# Patient Record
Sex: Female | Born: 1940 | Race: White | Hispanic: No | Smoking: Former smoker
Health system: Southern US, Community
[De-identification: ages and names within clinical notes are randomized; demographics above are authoritative.]

## PROBLEM LIST (undated history)

## (undated) DIAGNOSIS — Z1239 Encounter for other screening for malignant neoplasm of breast: Secondary | ICD-10-CM

## (undated) DIAGNOSIS — Z1211 Encounter for screening for malignant neoplasm of colon: Secondary | ICD-10-CM

## (undated) DIAGNOSIS — C801 Malignant (primary) neoplasm, unspecified: Secondary | ICD-10-CM

## (undated) DIAGNOSIS — R42 Dizziness and giddiness: Secondary | ICD-10-CM

## (undated) DIAGNOSIS — E7800 Pure hypercholesterolemia, unspecified: Secondary | ICD-10-CM

## (undated) DIAGNOSIS — F419 Anxiety disorder, unspecified: Secondary | ICD-10-CM

## (undated) DIAGNOSIS — R928 Other abnormal and inconclusive findings on diagnostic imaging of breast: Secondary | ICD-10-CM

## (undated) DIAGNOSIS — Z87891 Personal history of nicotine dependence: Secondary | ICD-10-CM

## (undated) DIAGNOSIS — R920 Mammographic microcalcification found on diagnostic imaging of breast: Secondary | ICD-10-CM

## (undated) DIAGNOSIS — M1990 Unspecified osteoarthritis, unspecified site: Secondary | ICD-10-CM

## (undated) DIAGNOSIS — I10 Essential (primary) hypertension: Secondary | ICD-10-CM

## (undated) HISTORY — DX: Mammographic microcalcification found on diagnostic imaging of breast: R92.0

## (undated) HISTORY — PX: EYE SURGERY: SHX253

## (undated) HISTORY — PX: BREAST SURGERY: SHX581

## (undated) HISTORY — PX: TONSILLECTOMY: SUR1361

## (undated) HISTORY — DX: Encounter for other screening for malignant neoplasm of breast: Z12.39

## (undated) HISTORY — DX: Other abnormal and inconclusive findings on diagnostic imaging of breast: R92.8

## (undated) HISTORY — PX: BREAST BIOPSY: SHX20

## (undated) HISTORY — DX: Personal history of nicotine dependence: Z87.891

## (undated) HISTORY — PX: TUBAL LIGATION: SHX77

## (undated) HISTORY — DX: Pure hypercholesterolemia, unspecified: E78.00

## (undated) HISTORY — DX: Essential (primary) hypertension: I10

## (undated) HISTORY — DX: Dizziness and giddiness: R42

## (undated) HISTORY — DX: Encounter for screening for malignant neoplasm of colon: Z12.11

---

## 2002-08-21 HISTORY — PX: VASCULAR SURGERY: SHX849

## 2002-08-21 HISTORY — PX: CAROTID ENDARTERECTOMY: SUR193

## 2003-01-23 ENCOUNTER — Ambulatory Visit (HOSPITAL_COMMUNITY): Admission: RE | Admit: 2003-01-23 | Discharge: 2003-01-24 | Payer: Self-pay

## 2003-09-29 ENCOUNTER — Other Ambulatory Visit: Payer: Self-pay

## 2004-06-27 ENCOUNTER — Ambulatory Visit: Payer: Self-pay | Admitting: Internal Medicine

## 2004-09-29 ENCOUNTER — Emergency Department: Payer: Self-pay | Admitting: Emergency Medicine

## 2005-03-09 ENCOUNTER — Ambulatory Visit: Payer: Self-pay | Admitting: Family Medicine

## 2005-10-03 ENCOUNTER — Ambulatory Visit: Payer: Self-pay | Admitting: Family Medicine

## 2006-03-19 ENCOUNTER — Ambulatory Visit: Payer: Self-pay | Admitting: Family Medicine

## 2007-02-18 ENCOUNTER — Ambulatory Visit: Payer: Self-pay | Admitting: Family Medicine

## 2007-03-27 ENCOUNTER — Ambulatory Visit: Payer: Self-pay | Admitting: Family Medicine

## 2008-12-03 ENCOUNTER — Ambulatory Visit: Payer: Self-pay | Admitting: Family Medicine

## 2009-08-21 HISTORY — PX: OTHER SURGICAL HISTORY: SHX169

## 2009-12-23 ENCOUNTER — Ambulatory Visit: Payer: Self-pay | Admitting: Family Medicine

## 2010-01-05 ENCOUNTER — Ambulatory Visit: Payer: Self-pay | Admitting: Family Medicine

## 2010-08-03 ENCOUNTER — Ambulatory Visit: Payer: Self-pay | Admitting: General Surgery

## 2010-08-21 HISTORY — PX: COLONOSCOPY: SHX174

## 2011-03-30 ENCOUNTER — Ambulatory Visit: Payer: Self-pay | Admitting: Family Medicine

## 2011-04-05 ENCOUNTER — Ambulatory Visit: Payer: Self-pay | Admitting: Family Medicine

## 2011-06-02 ENCOUNTER — Ambulatory Visit: Payer: Self-pay | Admitting: General Surgery

## 2011-08-22 HISTORY — PX: CATARACT EXTRACTION EXTRACAPSULAR: SHX1305

## 2011-10-10 ENCOUNTER — Ambulatory Visit: Payer: Self-pay | Admitting: General Surgery

## 2011-10-23 ENCOUNTER — Ambulatory Visit: Payer: Self-pay | Admitting: General Surgery

## 2012-01-25 ENCOUNTER — Ambulatory Visit: Payer: Self-pay | Admitting: Ophthalmology

## 2012-02-09 ENCOUNTER — Ambulatory Visit: Payer: Self-pay | Admitting: Ophthalmology

## 2012-04-23 ENCOUNTER — Ambulatory Visit: Payer: Self-pay | Admitting: General Surgery

## 2012-04-24 ENCOUNTER — Ambulatory Visit: Payer: Self-pay | Admitting: General Surgery

## 2012-05-01 DIAGNOSIS — R928 Other abnormal and inconclusive findings on diagnostic imaging of breast: Secondary | ICD-10-CM | POA: Insufficient documentation

## 2012-05-01 DIAGNOSIS — I10 Essential (primary) hypertension: Secondary | ICD-10-CM | POA: Insufficient documentation

## 2012-09-08 DIAGNOSIS — R928 Other abnormal and inconclusive findings on diagnostic imaging of breast: Secondary | ICD-10-CM

## 2012-09-08 DIAGNOSIS — I10 Essential (primary) hypertension: Secondary | ICD-10-CM

## 2012-09-28 ENCOUNTER — Encounter: Payer: Self-pay | Admitting: General Surgery

## 2012-10-23 ENCOUNTER — Ambulatory Visit: Payer: Self-pay | Admitting: General Surgery

## 2012-11-05 ENCOUNTER — Ambulatory Visit: Payer: Self-pay | Admitting: General Surgery

## 2012-11-09 ENCOUNTER — Emergency Department: Payer: Self-pay | Admitting: Emergency Medicine

## 2012-11-09 LAB — COMPREHENSIVE METABOLIC PANEL
Albumin: 4.1 g/dL (ref 3.4–5.0)
Alkaline Phosphatase: 63 U/L (ref 50–136)
BUN: 7 mg/dL (ref 7–18)
Bilirubin,Total: 0.6 mg/dL (ref 0.2–1.0)
EGFR (African American): >60
EGFR (Non-African Amer.): >60
Potassium: 3.6 mmol/L (ref 3.5–5.1)
Total Protein: 8 g/dL (ref 6.4–8.2)

## 2012-11-09 LAB — CBC
HCT: 43.9 % (ref 35.0–47.0)
HGB: 15 g/dL (ref 12.0–16.0)
MCH: 32.6 pg (ref 26.0–34.0)
MCHC: 34.1 g/dL (ref 32.0–36.0)
Platelet: 218 10*3/uL (ref 150–440)
RBC: 4.6 10*6/uL (ref 3.80–5.20)
RDW: 13.8 % (ref 11.5–14.5)
WBC: 9.5 10*3/uL (ref 3.6–11.0)

## 2012-11-19 ENCOUNTER — Ambulatory Visit (INDEPENDENT_AMBULATORY_CARE_PROVIDER_SITE_OTHER): Payer: Medicare Other | Admitting: General Surgery

## 2012-11-19 ENCOUNTER — Encounter: Payer: Self-pay | Admitting: General Surgery

## 2012-11-19 VITALS — BP 130/72 | HR 70 | Resp 14 | Ht 64.0 in | Wt 153.0 lb

## 2012-11-19 DIAGNOSIS — R928 Other abnormal and inconclusive findings on diagnostic imaging of breast: Secondary | ICD-10-CM

## 2012-11-19 DIAGNOSIS — I10 Essential (primary) hypertension: Secondary | ICD-10-CM

## 2012-11-19 NOTE — Progress Notes (Signed)
Patient ID: Kim Espinoza, female   DOB: [birth year], 72 y.o.   MRN: [id number]  Chief Complaint  Patient presents with  . Breast Problem    mammogram    HPI Kim Espinoza is a 72 y.o. female.  Patient here today for follow up mammogram.  No new breast issues.  Patient with known history of right breast biopsy 2011 and 2013 benign. Sister with history of breast cancer.  HPI  Past Medical History  Diagnosis Date  . Hypercholesterolemia since 2001  . 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 Abnormal mammogram 2011, 2012    Right side.   . Hypertension     since approx. 2001  . Personal history of tobacco use, presenting hazards to health   . Breast screening, unspecified   . Mammographic microcalcification   . Special screening for malignant neoplasms, colon     Past Surgical History  Procedure Laterality Date  . Cataract extraction extracapsular  2013    Left eye  . Colonoscopy  2012    Dr. Lemar Livings  . Breast biopsy-right  2011    Stereotactic - Dr. Lemar Livings- Benign breast tissue containing stromal calcifications wiithin.  Periductal Fibrosis.  . Carotid endarterectomy  2004  . Eye surgery    . Vascular surgery    . Breast surgery Right 2011, 2013    stero biopsy    Family History  Problem Relation Age of Onset  . Cancer Father     lung cancer - deceased  . Cancer Sister 70    breast cancer - alive and well  . Colon cancer Neg Hx   . Ovarian cancer Neg Hx     Social History History  Substance Use Topics  . Smoking status: Never Smoker   . Smokeless tobacco: Never Used  . Alcohol Use: No     Comment: never drinks alcohol    No Known Allergies  Current Outpatient Prescriptions  Medication Sig Dispense Refill  . amoxicillin-clavulanate (AUGMENTIN) 875-125 MG per tablet Take 1 tablet by mouth 2 (two) times daily.       Marland Kitchen atenolol (TENORMIN) 50 MG tablet Take 50 mg by mouth 2 (two) times daily.      . Flaxseed, Linseed, (FLAXSEED OIL) 1000 MG CAPS Take by mouth daily.       . nortriptyline (PAMELOR) 25 MG capsule Take 25 mg by mouth at bedtime.      . Pyridoxine HCl (VITAMIN B-6) 250 MG tablet Take 250 mg by mouth daily.      . simvastatin (ZOCOR) 10 MG tablet Take 10 mg by mouth at bedtime.      . vitamin B-12 (CYANOCOBALAMIN) 250 MCG tablet Take 250 mcg by mouth daily.       No current facility-administered medications for this visit.    Review of Systems Review of Systems  Constitutional: Negative.   Respiratory: Negative.   Cardiovascular: Negative.     Blood pressure 130/72, pulse 70, resp. rate 14, height 5\' 4"  (1.626 m), weight 153 lb (69.4 kg).  Physical Exam Physical Exam  Constitutional: She is oriented to person, place, and time. She appears well-developed and well-nourished.  Cardiovascular: Normal rate and regular rhythm.   Pulmonary/Chest: Effort normal and breath sounds normal. Right breast exhibits no inverted nipple, no mass, no nipple discharge, no skin change and no tenderness. Left breast exhibits no inverted nipple, no mass, no nipple discharge, no skin change and no tenderness. Breasts are symmetrical.  Lymphadenopathy:    She has  no cervical adenopathy.    She has no axillary adenopathy.  Neurological: She is alert and oriented to person, place, and time.  Skin: Skin is warm and dry.    Data Reviewed Left breast mammogram dated October 23, 2012 showed scattered fibroglandular density. Parenchymal density most conspicuous on the true lateral film in the upper aspect of the breast. Focal spot compression views did not show a discrete mass. BI-RAD-2.  Assessment    Benign breast exam.     Plan    The patient will resume annual bilateral mammograms with her primary care provider in September 2014.        Kim Espinoza 11/20/2012, 9:01 PM

## 2012-11-19 NOTE — Patient Instructions (Addendum)
Follow up with Dr Burnett Sheng for annual mammograms.

## 2012-11-20 ENCOUNTER — Encounter: Payer: Self-pay | Admitting: General Surgery

## 2012-11-20 DIAGNOSIS — I10 Essential (primary) hypertension: Secondary | ICD-10-CM | POA: Insufficient documentation

## 2012-12-31 ENCOUNTER — Encounter: Payer: Self-pay | Admitting: General Surgery

## 2013-04-29 ENCOUNTER — Ambulatory Visit: Payer: Self-pay | Admitting: Family Medicine

## 2014-06-22 ENCOUNTER — Encounter: Payer: Self-pay | Admitting: General Surgery

## 2014-07-14 ENCOUNTER — Ambulatory Visit: Payer: Self-pay | Admitting: Family Medicine

## 2014-12-13 NOTE — Op Note (Signed)
PATIENT NAME:  Kim Espinoza, Kim Espinoza MR#:  [id number] DATE OF BIRTH:  April 21, 1941  DATE OF PROCEDURE:  02/09/2012  PREOPERATIVE DIAGNOSIS:  Cataract, left eye.    POSTOPERATIVE DIAGNOSIS:  Cataract, left eye.  PROCEDURE PERFORMED:  Extracapsular cataract extraction using phacoemulsification with placement of an Alcon [id number], 20.5-diopter posterior chamber lens, serial [id number].  SURGEON:  Loura Back. Fransico Sciandra, MD  ASSISTANT:  None.  ANESTHESIA:  4% lidocaine and 0.75% Marcaine in a 50/50 mixture with 10 units/mL of Hylenex added, given as a peribulbar.   ANESTHESIOLOGIST:  Dr. Kayleen Memos  COMPLICATIONS:  None.  ESTIMATED BLOOD LOSS:  Less than 1 ml.  DESCRIPTION OF PROCEDURE:  The patient was brought to the operating room and given a peribulbar block.  The patient was then prepped and draped in the usual fashion.  The vertical rectus muscles were imbricated using 5-0 silk sutures.  These sutures were then clamped to the sterile drapes as bridle sutures.  A limbal peritomy was performed extending two clock hours and hemostasis was obtained with cautery.  A partial thickness scleral groove was made at the surgical limbus and dissected anteriorly in a lamellar dissection using an Alcon crescent knife.  The anterior chamber was entered supero-temporally with a Superblade and through the lamellar dissection with a 2.6 mm keratome.  DisCoVisc was used to replace the aqueous and a continuous tear capsulorrhexis was carried out.  Hydrodissection and hydrodelineation were carried out with balanced salt and a 27 gauge canula.  The nucleus was rotated to confirm the effectiveness of the hydrodissection.  Phacoemulsification was carried out using a divide-and-conquer technique.  Total ultrasound time was 1 minute and 34 seconds with an average power of 21.2 percent and CDE of 31.49.  Irrigation/aspiration was used to remove the residual cortex.  DisCoVisc was used to inflate the capsule and the internal  incision was enlarged to 3 mm with the crescent knife.  The intraocular lens was folded and inserted into the capsular bag using the AcrySert delivery system.  Irrigation/aspiration was used to remove the residual DisCoVisc.  Miostat was injected into the anterior chamber through the paracentesis track to inflate the anterior chamber and induce miosis.  The wound was checked for leaks and none were found. The conjunctiva was closed with cautery and the bridle sutures were removed.  Two drops of 0.3% Vigamox were placed on the eye.   An eye shield was placed on the eye.  The patient was discharged to the recovery room in good condition.  ____________________________ Loura Back Livian Vanderbeck, MD sad:slb D: 02/09/2012 12:04:55 ET T: 02/09/2012 12:16:51 ET JOB#: [id number]  cc: Remo Lipps A. Bexley Laubach, MD, <Dictator> Martie Lee MD ELECTRONICALLY SIGNED 02/12/2012 14:37

## 2015-06-30 ENCOUNTER — Other Ambulatory Visit: Payer: Self-pay | Admitting: Family Medicine

## 2015-06-30 DIAGNOSIS — Z1231 Encounter for screening mammogram for malignant neoplasm of breast: Secondary | ICD-10-CM

## 2015-07-19 ENCOUNTER — Ambulatory Visit
Admission: RE | Admit: 2015-07-19 | Discharge: 2015-07-19 | Disposition: A | Discharge status: Home / Self Care | Payer: Medicare Other | Source: Ambulatory Visit | Attending: Family Medicine | Admitting: Family Medicine

## 2015-07-19 ENCOUNTER — Other Ambulatory Visit: Payer: Self-pay | Admitting: Family Medicine

## 2015-07-19 DIAGNOSIS — Z1231 Encounter for screening mammogram for malignant neoplasm of breast: Secondary | ICD-10-CM

## 2016-02-15 ENCOUNTER — Emergency Department: Payer: Medicare Other

## 2016-02-15 ENCOUNTER — Emergency Department
Admission: EM | Admit: 2016-02-15 | Discharge: 2016-02-15 | Disposition: A | Discharge status: Home / Self Care | Payer: Medicare Other | Attending: Emergency Medicine | Admitting: Emergency Medicine

## 2016-02-15 ENCOUNTER — Encounter: Payer: Self-pay | Admitting: Emergency Medicine

## 2016-02-15 DIAGNOSIS — Y9389 Activity, other specified: Secondary | ICD-10-CM | POA: Insufficient documentation

## 2016-02-15 DIAGNOSIS — S6991XA Unspecified injury of right wrist, hand and finger(s), initial encounter: Secondary | ICD-10-CM | POA: Diagnosis present

## 2016-02-15 DIAGNOSIS — E7800 Pure hypercholesterolemia, unspecified: Secondary | ICD-10-CM | POA: Insufficient documentation

## 2016-02-15 DIAGNOSIS — Y999 Unspecified external cause status: Secondary | ICD-10-CM | POA: Diagnosis not present

## 2016-02-15 DIAGNOSIS — Z85038 Personal history of other malignant neoplasm of large intestine: Secondary | ICD-10-CM | POA: Diagnosis not present

## 2016-02-15 DIAGNOSIS — S63501A Unspecified sprain of right wrist, initial encounter: Secondary | ICD-10-CM

## 2016-02-15 DIAGNOSIS — W010XXA Fall on same level from slipping, tripping and stumbling without subsequent striking against object, initial encounter: Secondary | ICD-10-CM | POA: Insufficient documentation

## 2016-02-15 DIAGNOSIS — Y929 Unspecified place or not applicable: Secondary | ICD-10-CM | POA: Insufficient documentation

## 2016-02-15 DIAGNOSIS — I10 Essential (primary) hypertension: Secondary | ICD-10-CM | POA: Diagnosis not present

## 2016-02-15 NOTE — ED Provider Notes (Signed)
[facility] Emergency Department Provider Note ____________________________________________  Time seen: 65  I have reviewed the triage vital signs and the nursing notes.  HISTORY  Chief Complaint  Wrist Pain  HPI Kim Espinoza is a 75 y.o. female presents to the ED for evaluation of pain to the right wrist. She describes trip and a fall just prior to arrival. She attempted to catch herself using her right wrist and noted injury. She reports pain and swelling primarily to the volar aspect of the wrist. She describes tightness when she flexes the finger tips. She did dose ibuprofen just prior to arrival. She denies any deformity, swelling, or laceration.She denies any other injury at this time.  Past Medical History  Diagnosis Date  . Hypercholesterolemia since 2001  . 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 Abnormal mammogram 2011, 2012    Right side.   . Hypertension     since approx. 2001  . Personal history of tobacco use, presenting hazards to health   . Breast screening, unspecified   . Mammographic microcalcification   . Special screening for malignant neoplasms, colon     Patient Active Problem List   Diagnosis Date Noted  . Essential hypertension, benign 11/20/2012  . Abnormal mammogram 05/01/2012  . Hypertension 05/01/2012    Past Surgical History  Procedure Laterality Date  . Cataract extraction extracapsular  2013    Left eye  . Colonoscopy  2012    Dr. Bary Castilla  . Breast biopsy-right  2011    Stereotactic - Dr. Bary Castilla- Benign breast tissue containing stromal calcifications wiithin.  Periductal Fibrosis.  . Carotid endarterectomy  2004  . Eye surgery    . Vascular surgery    . Breast surgery Right 2011, 2013    stero biopsy  . Breast biopsy Right ??    x2 - core w/clip - neg    Current Outpatient Rx  Name  Route  Sig  Dispense  Refill  . amoxicillin-clavulanate (AUGMENTIN) 875-125 MG per tablet   Oral   Take 1 tablet by mouth 2 (two)  times daily.          Marland Kitchen atenolol (TENORMIN) 50 MG tablet   Oral   Take 50 mg by mouth 2 (two) times daily.         . Flaxseed, Linseed, (FLAXSEED OIL) 1000 MG CAPS   Oral   Take by mouth daily.         . nortriptyline (PAMELOR) 25 MG capsule   Oral   Take 25 mg by mouth at bedtime.         . Pyridoxine HCl (VITAMIN B-6) 250 MG tablet   Oral   Take 250 mg by mouth daily.         . simvastatin (ZOCOR) 10 MG tablet   Oral   Take 10 mg by mouth at bedtime.         . vitamin B-12 (CYANOCOBALAMIN) 250 MCG tablet   Oral   Take 250 mcg by mouth daily.          Allergies Review of patient's allergies indicates no known allergies.  Family History  Problem Relation Age of Onset  . Cancer Father     lung cancer - deceased  . Cancer Sister 46    breast cancer - alive and well  . Breast cancer Sister   . Colon cancer Neg Hx   . Ovarian cancer Neg Hx   . Breast cancer Paternal Aunt  Social History Social History  Substance Use Topics  . Smoking status: Never Smoker   . Smokeless tobacco: Never Used  . Alcohol Use: No     Comment: never drinks alcohol   Review of Systems  Constitutional: Negative for fever. Cardiovascular: Negative for chest pain. Respiratory: Negative for shortness of breath. Gastrointestinal: Negative for abdominal pain, vomiting and diarrhea. Musculoskeletal: Negative for back pain. Right wrist pain as above.  Neurological: Negative for headaches, focal weakness or numbness. ____________________________________________  PHYSICAL EXAM:  VITAL SIGNS: ED Triage Vitals  Enc Vitals Group     BP 02/15/16 1903 205/71 mmHg     Pulse Rate 02/15/16 1900 72     Resp 02/15/16 1900 20     Temp 02/15/16 1900 97.9 F (36.6 C)     Temp Source 02/15/16 1900 Oral     SpO2 02/15/16 1900 98 %     Weight 02/15/16 1900 143 lb (64.864 kg)     Height 02/15/16 1900 5\' 3"  (1.6 m)     Head Cir --      Peak Flow --      Pain Score --      Pain Loc  --      Pain Edu? --      Excl. in [place]? --    Constitutional: Alert and oriented. Well appearing and in no distress. Head: Normocephalic and atraumatic. Cardiovascular: Normal rate, regular rhythm.  Normal distal pulses and cap refill.  Respiratory: Normal respiratory effort. Musculoskeletal: Right wrist with mild volar swelling. No obvious deformity, dislocation, or effusion. Patient with normal composite fist. Normal elbow range of motion on exam. Nontender with normal range of motion in all extremities.  Neurologic: Cranial nerves II through XII grossly intact. Normal intrinsic & opposition testing.  Normal speech and language. No gross focal neurologic deficits are appreciated. Skin:  Skin is warm, dry and intact. No rash noted. ____________________________________________   RADIOLOGY  Right Wrist IMPRESSION: Negative.  I, Raffaele Derise, Dannielle Karvonen, personally viewed and evaluated these images (plain radiographs) as part of my medical decision making, as well as reviewing the written report by the radiologist. ____________________________________________  PROCEDURES  Wrist cock up splint ____________________________________________  INITIAL IMPRESSION / ASSESSMENT AND PLAN / ED COURSE  Patient with a right wrist sprain status post fall. No radiologic evidence of fracture dislocation is appreciated. The patient spit with a wrist cock-up splint for comfort. She will follow up with her primary care provider or Dr. Earnestine Leys for ongoing symptom management. She is advised to dose over-the-counter ibuprofen for pain and inflammation management as well as applying ice for local swelling. ____________________________________________  FINAL CLINICAL IMPRESSION(S) / ED DIAGNOSES  Final diagnoses:  Wrist sprain, right, initial encounter      Melvenia Needles, PA-C 02/15/16 2020  Lisa Roca, MD 02/15/16 2100

## 2016-02-15 NOTE — ED Notes (Signed)
Pt states she tripped and went to catch herself injuring her right wrist..

## 2016-02-15 NOTE — Discharge Instructions (Signed)
Your x-ray does not show any fracture (break) or dislocation to the wrist. You appear to have a moderate sprain to the wrist. Wear the wrist splint for comfort and support for the next week or so. Take Motrin (ibuprofen) or Aleve (naproxen sodium) for pain and inflammation relief. Apply ice to reduce swelling. Follow-up with Dr. Sabra Heck for continued pain or disability.  Wrist Sprain  A sprain is an injury in which a ligament that maintains the proper alignment of a joint is partially or completely torn. The ligaments of the wrist are susceptible to sprains. Sprains are classified into three categories. Grade 1 sprains cause pain, but the tendon is not lengthened. Grade 2 sprains include a lengthened ligament because the ligament is stretched or partially ruptured. With grade 2 sprains there is still function, although the function may be diminished. Grade 3 sprains are characterized by a complete tear of the tendon or muscle, and function is usually impaired. SYMPTOMS   Pain tenderness, inflammation, and/or bruising (contusion) of the injury.  A "pop" or tear felt and/or heard at the time of injury.  Decreased wrist function. CAUSES  A wrist sprain occurs when a force is placed on one or more ligaments that is greater than it/they can withstand. Common mechanisms of injury include:  Catching a ball with your hands.  Repetitive and/ or strenuous extension or flexion of the wrist. RISK INCREASES WITH:  Previous wrist injury.  Contact sports (boxing or wrestling).  Activities in which falling is common.  Poor strength and flexibility.  Improperly fitted or padded protective equipment. PREVENTION  Warm up and stretch properly before activity.  Allow for adequate recovery between workouts.  Maintain physical fitness:  Strength, flexibility, and endurance.  Cardiovascular fitness.  Protect the wrist joint by limiting its motion with the use of taping, braces, or  splints.  Protect the wrist after injury for 6 to 12 months. PROGNOSIS  The prognosis for wrist sprains depends on the degree of injury. Grade 1 sprains require 2 to 6 weeks of treatment. Grade 2 sprains require 6 to 8 weeks of treatment, and grade 3 sprains require up to 12 weeks.  RELATED COMPLICATIONS   Prolonged healing time, if improperly treated or re-injured.  Recurrent symptoms that result in a chronic problem.  Injury to nearby structures (bone, cartilage, nerves, or tendons).  Arthritis of the wrist.  Inability to compete in athletics at a high level.  Wrist stiffness or weakness.  Progression to a complete rupture of the ligament. TREATMENT  Treatment initially involves resting from any activities that aggravate the symptoms, and the use of ice and medications to help reduce pain and inflammation. Your caregiver may recommend immobilizing the wrist for a period of time in order to reduce stress on the ligament and allow for healing. After immobilization it is important to perform strengthening and stretching exercises to help regain strength and a full range of motion. These exercises may be completed at home or with a therapist. Surgery is not usually required for wrist sprains, unless the ligament has been ruptured (grade 3 sprain). MEDICATION   If pain medication is necessary, then nonsteroidal anti-inflammatory medications, such as aspirin and ibuprofen, or other minor pain relievers, such as acetaminophen, are often recommended.  Do not take pain medication for 7 days before surgery.  Prescription pain relievers may be given if deemed necessary by your caregiver. Use only as directed and only as much as you need. HEAT AND COLD  Cold treatment (icing) relieves  pain and reduces inflammation. Cold treatment should be applied for 10 to 15 minutes every 2 to 3 hours for inflammation and pain and immediately after any activity that aggravates your symptoms. Use ice packs or  massage the area with a piece of ice (ice massage).  Heat treatment may be used prior to performing the stretching and strengthening activities prescribed by your caregiver, physical therapist, or athletic trainer. Use a heat pack or soak your injury in warm water. SEEK MEDICAL CARE IF:  Treatment seems to offer no benefit, or the condition worsens.  Any medications produce adverse side effects.   This information is not intended to replace advice given to you by your health care provider. Make sure you discuss any questions you have with your health care provider.   Document Released: 08/07/2005 Document Revised: 04/28/2015 Document Reviewed: 11/19/2008 Elsevier Interactive Patient Education [employer].

## 2016-06-14 ENCOUNTER — Encounter: Payer: Self-pay | Admitting: Emergency Medicine

## 2016-06-14 ENCOUNTER — Emergency Department
Admission: EM | Admit: 2016-06-14 | Discharge: 2016-06-14 | Disposition: A | Discharge status: Home / Self Care | Payer: Medicare Other | Attending: Emergency Medicine | Admitting: Emergency Medicine

## 2016-06-14 ENCOUNTER — Emergency Department: Payer: Medicare Other

## 2016-06-14 DIAGNOSIS — R319 Hematuria, unspecified: Secondary | ICD-10-CM

## 2016-06-14 DIAGNOSIS — I10 Essential (primary) hypertension: Secondary | ICD-10-CM | POA: Insufficient documentation

## 2016-06-14 DIAGNOSIS — K921 Melena: Secondary | ICD-10-CM | POA: Diagnosis not present

## 2016-06-14 DIAGNOSIS — N3091 Cystitis, unspecified with hematuria: Secondary | ICD-10-CM | POA: Diagnosis not present

## 2016-06-14 DIAGNOSIS — R1031 Right lower quadrant pain: Secondary | ICD-10-CM | POA: Diagnosis present

## 2016-06-14 DIAGNOSIS — Z79899 Other long term (current) drug therapy: Secondary | ICD-10-CM | POA: Insufficient documentation

## 2016-06-14 DIAGNOSIS — N3090 Cystitis, unspecified without hematuria: Secondary | ICD-10-CM

## 2016-06-14 LAB — CBC
HCT: 41.7 % (ref 35.0–47.0)
HEMOGLOBIN: 14.4 g/dL (ref 12.0–16.0)
MCH: 31.3 pg (ref 26.0–34.0)
MCHC: 34.4 g/dL (ref 32.0–36.0)
MCV: 91.1 fL (ref 80.0–100.0)
Platelets: 208 10*3/uL (ref 150–440)
RBC: 4.58 MIL/uL (ref 3.80–5.20)
RDW: 14.1 % (ref 11.5–14.5)
WBC: 11.2 10*3/uL — ABNORMAL HIGH (ref 3.6–11.0)

## 2016-06-14 LAB — URINALYSIS COMPLETE WITH MICROSCOPIC (ARMC ONLY)
BILIRUBIN URINE: NEGATIVE
Bacteria, UA: NONE SEEN
Bilirubin Urine: NEGATIVE
GLUCOSE, UA: 50 mg/dL — AB
GLUCOSE, UA: NEGATIVE mg/dL
Ketones, ur: NEGATIVE mg/dL
Ketones, ur: NEGATIVE mg/dL
LEUKOCYTES UA: NEGATIVE
LEUKOCYTES UA: NEGATIVE
NITRITE: NEGATIVE
Nitrite: NEGATIVE
PH: 7 (ref 5.0–8.0)
Protein, ur: 100 mg/dL — AB
Protein, ur: 100 mg/dL — AB
SPECIFIC GRAVITY, URINE: 1.001 — AB (ref 1.005–1.030)
Specific Gravity, Urine: 1.002 — ABNORMAL LOW (ref 1.005–1.030)
pH: 7 (ref 5.0–8.0)

## 2016-06-14 LAB — COMPREHENSIVE METABOLIC PANEL
ALK PHOS: 47 U/L (ref 38–126)
ALT: 28 U/L (ref 14–54)
ANION GAP: 7 (ref 5–15)
AST: 31 U/L (ref 15–41)
Albumin: 4.1 g/dL (ref 3.5–5.0)
BILIRUBIN TOTAL: 0.9 mg/dL (ref 0.3–1.2)
BUN: 7 mg/dL (ref 6–20)
CALCIUM: 9.6 mg/dL (ref 8.9–10.3)
CO2: 28 mmol/L (ref 22–32)
CREATININE: 0.62 mg/dL (ref 0.44–1.00)
Chloride: 105 mmol/L (ref 101–111)
Glucose, Bld: 109 mg/dL — ABNORMAL HIGH (ref 65–99)
Potassium: 4.3 mmol/L (ref 3.5–5.1)
Sodium: 140 mmol/L (ref 135–145)
TOTAL PROTEIN: 7.8 g/dL (ref 6.5–8.1)

## 2016-06-14 LAB — WET PREP, GENITAL
CLUE CELLS WET PREP: NONE SEEN
Sperm: NONE SEEN
Trich, Wet Prep: NONE SEEN
Yeast Wet Prep HPF POC: NONE SEEN

## 2016-06-14 LAB — CHLAMYDIA/NGC RT PCR (ARMC ONLY)
CHLAMYDIA TR: NOT DETECTED
N GONORRHOEAE: NOT DETECTED

## 2016-06-14 MED ORDER — IOPAMIDOL (ISOVUE-370) INJECTION 76%
100.0000 mL | Freq: Once | INTRAVENOUS | Status: AC | PRN
  Administered 2016-06-14: 100 mL via INTRAVENOUS

## 2016-06-14 MED ORDER — SODIUM CHLORIDE 0.9 % IV BOLUS (SEPSIS)
1000.0000 mL | Freq: Once | INTRAVENOUS | Status: AC
  Administered 2016-06-14: 1000 mL via INTRAVENOUS

## 2016-06-14 NOTE — ED Notes (Signed)
Presents with family   States she went out to get the paper this am  And felt slight pain to right groin area and noticed a large amt of blood when she used the bathroom  ..feels like it is all coming from vaginal area

## 2016-06-14 NOTE — ED Notes (Signed)
Pt with dime sized blood clot in urine specimen cup.

## 2016-06-14 NOTE — ED Notes (Signed)
Patient transported to CT 

## 2016-06-14 NOTE — ED Triage Notes (Signed)
Pt reports hematuria today, reports burning with urination and RLQ pain with urination.

## 2016-06-14 NOTE — ED Notes (Signed)
D/c inst to pt and family.   

## 2016-06-14 NOTE — ED Notes (Signed)
Iv started  And fluids infusing.

## 2016-06-14 NOTE — ED Notes (Signed)
Resumed care from [place] rn.  Pelvic done.  toleratied well.  Family with pt.

## 2016-06-14 NOTE — Discharge Instructions (Signed)
Please drink plenty of fluids stay well-hydrated. Please take the entire course of antibiotics, even if you're feeling better.  Please call to make an appointment with Dr. [name], who will give you the results of the CT scan he had in the emergency department and make a plan regarding the blood in your urine.  Please also make an appointment with Dr. Vira Agar, because of the bleeding in your stool.  Return to the emergency department for severe pain, lightheadedness or fainting, shortness of breath, bleeding, fever, or any other symptoms concerning to you.

## 2016-06-14 NOTE — ED Provider Notes (Signed)
[facility] Emergency Department Provider Note  ____________________________________________  Time seen: Approximately 3:30 PM  I have reviewed the triage vital signs and the nursing notes.   HISTORY  Chief Complaint Hematuria    HPI Kim Espinoza is a 75 y.o. female with a history of HTN, HL, not anticoagulated and without any history of gynecologic cancers presenting for bleeding. The patient reports that since early October she has had a right lower quadrant discomfort "like a cuckaberry bush" that is intermittent. Her primary care physician sent her for transvaginal ultrasound which she states was reportedly normal. This morning, she was walking when she had a recurrence of some mild right lower quadrant pain. The next time she went to the bathroom, she noted a significant amount of blood and blood clots in the toilet. She is unclear whether this blood came from her urethra or her vagina; she does not think that it came from her rectum. She has continued to have oozing of blood for the last several hours. She denies any shortness of breath, lightheadedness or fainting. No chest pain or palpitations. Last menstrual period was greater than 15 years ago. Last sexual intercourse greater than 30 years ago. Denies any foreign body in the vagina.  The patient was seen by a 14 assistant and her primary care physician's office and diagnosed with a UTI based off of a urinalysis, but no gynecologic examination was performed. She does report being treated with metronidazole for bacterial vaginosis 05/25/16.  FH: No family history of gynecologic cancers.   Past Medical History:  Diagnosis Date  . Abnormal mammogram 2011, 2012   Right side.   . Breast screening, unspecified   . Hypercholesterolemia since 2001  . Hypertension    since approx. 2001  . Mammographic microcalcification   . Personal history of tobacco use, presenting hazards to health   . Special  screening for malignant neoplasms, colon   . 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 Essential hypertension, benign 11/20/2012  . Abnormal mammogram 05/01/2012  . Hypertension 05/01/2012    Past Surgical History:  Procedure Laterality Date  . BREAST BIOPSY Right ??   x2 - core w/clip - neg  . BREAST BIOPSY-right  2011   Stereotactic - Dr. Bary Castilla- Benign breast tissue containing stromal calcifications wiithin.  Periductal Fibrosis.  Marland Kitchen BREAST SURGERY Right 2011, 2013   stero biopsy  . CAROTID ENDARTERECTOMY  2004  . CATARACT EXTRACTION EXTRACAPSULAR  2013   Left eye  . COLONOSCOPY  2012   Dr. Bary Castilla  . EYE SURGERY    . VASCULAR SURGERY      Current Outpatient Rx  . Order #: [id number] Class: Historical Med  . Order #: [id number] Class: Historical Med  . Order #: [id number] Class: Historical Med  . Order #: [id number] Class: Historical Med  . Order #: [id number] Class: Historical Med  . Order #: [id number] Class: Historical Med  . Order #: [id number] Class: Historical Med    Allergies Review of patient's allergies indicates no known allergies.  Family History  Problem Relation Age of Onset  . Cancer Father     lung cancer - deceased  . Cancer Sister 44    breast cancer - alive and well  . Breast cancer Sister   . Breast cancer Paternal Aunt   . Colon cancer Neg Hx   . Ovarian cancer Neg Hx     Social History Social History  Substance Use Topics  .  Smoking status: Never Smoker  . Smokeless tobacco: Never Used  . Alcohol use No     Comment: never drinks alcohol    Review of Systems Constitutional: No fever/chills.No lightheadedness or syncope. No recent weight loss. Eyes: No visual changes. ENT:  No congestion or rhinorrhea. Cardiovascular: Denies chest pain. Denies palpitations. Respiratory: Denies shortness of breath.  No cough. Gastrointestinal: Positive right lower quadrant abdominal pain.  No nausea, no vomiting.  No diarrhea.   No constipation. Genitourinary: Negative for dysuria. Positive for possible vaginal or urethral bleeding. Negative for urinary frequency. Positive for unchanged chronic mild stress incontinence. Musculoskeletal: Negative for back pain. Skin: Negative for rash. Neurological: Negative for headaches. No focal numbness, tingling or weakness.   10-point ROS otherwise negative.  ____________________________________________   PHYSICAL EXAM:  VITAL SIGNS: ED Triage Vitals [06/14/16 1306]  Enc Vitals Group     BP (!) 216/79     Pulse Rate 88     Resp 20     Temp 98.1 F (36.7 C)     Temp Source Oral     SpO2 98 %     Weight 145 lb (65.8 kg)     Height 5\' 3"  (1.6 m)     Head Circumference      Peak Flow      Pain Score 0     Pain Loc      Pain Edu?      Excl. in [place]?     Constitutional: Alert and oriented. Well appearing and in no acute distress. Answers questions appropriately. Eyes: Conjunctivae are normalAnd without pallor.  EOMI. No scleral icterus. Head: Atraumatic. Nose: No congestion/rhinnorhea. Mouth/Throat: Mucous membranes are moist.  Neck: No stridor.  Supple.  No meningismus. Cardiovascular: Normal rate, regular rhythm. No murmurs, rubs or gallops.  Respiratory: Normal respiratory effort.  No accessory muscle use or retractions. Lungs CTAB.  No wheezes, rales or ronchi. Gastrointestinal: Soft, nontender and nondistended. I'm unable to reproduce the patient's discomfort. No guarding or rebound.  No peritoneal signs. Genitourinary: Normal-appearing external genitalia without lesions. Normal vaginal exam with mildly atrophic vaginal tissue and minimal clear discharge, normal-appearing cervix. No evidence of vaginal bleeding. Bimanual exam is negative for CMT, adnexal tenderness to palpation, no palpable masses. Rectal examination without external palpable internal hemorrhoids. Stool is dark and guaiac positive. No pain with rectal examination. Musculoskeletal: No LE edema.   Neurologic:  A&Ox3.  Speech is clear.  Face and smile are symmetric.  EOMI.  Moves all extremities well. Skin:  Skin is warm, dry and intact. No rash noted. No pallor. Psychiatric: Mood and affect are normal. Speech and behavior are normal.  Normal judgement.  ____________________________________________   LABS (all labs ordered are listed, but only abnormal results are displayed)  Labs Reviewed  WET PREP, GENITAL - Abnormal; Notable for the following:       Result Value   WBC, Wet Prep HPF POC FEW (*)    All other components within normal limits  URINALYSIS COMPLETEWITH MICROSCOPIC ([facility] ONLY) - Abnormal; Notable for the following:    Color, Urine RED (*)    APPearance CLEAR (*)    Glucose, UA 50 (*)    Specific Gravity, Urine 1.002 (*)    Hgb urine dipstick 2+ (*)    Protein, ur 100 (*)    Squamous Epithelial / LPF 0-5 (*)    All other components within normal limits  CBC - Abnormal; Notable for the following:    WBC 11.2 (*)  All other components within normal limits  COMPREHENSIVE METABOLIC PANEL - Abnormal; Notable for the following:    Glucose, Bld 109 (*)    All other components within normal limits  URINALYSIS COMPLETEWITH MICROSCOPIC ([facility] ONLY) - Abnormal; Notable for the following:    Color, Urine RED (*)    APPearance CLEAR (*)    Specific Gravity, Urine 1.001 (*)    Hgb urine dipstick 3+ (*)    Protein, ur 100 (*)    Bacteria, UA RARE (*)    Squamous Epithelial / LPF 0-5 (*)    All other components within normal limits  CHLAMYDIA/NGC RT PCR ([facility] ONLY)   ____________________________________________  EKG  Not indicated ____________________________________________  RADIOLOGY  No results found.  ____________________________________________   PROCEDURES  Procedure(s) performed: None  Procedures  Critical Care performed: No ____________________________________________   INITIAL IMPRESSION / ASSESSMENT AND PLAN / ED  COURSE  Pertinent labs & imaging results that were available during my care of the patient were reviewed by me and considered in my medical decision making (see chart for details).  75 y.o. female who is not anticoagulated presenting with bleeding, thought to be either vaginal or urethral. On physical examination, the patient is mildly hypertensive with a normal heart rate. Her abdomen is soft and nontender. I am concerned about vaginal bleeding causes including infection, malignancy, tear or abrasion in atrophic postmenopausal tissue. We will also get an in and out catheter to confirm the results of her urinalysis as I believe her urine test is probably contaminated from vaginal blood and do not want to commit her to antibiotics for UTI if she does not in fact have a UTI. Will also rule out anemia.  ----------------------------------------- 3:56 PM on 06/14/2016 -----------------------------------------  At this time, does not appear that the patient has any bleeding in the vaginal vault. It is possible that she had vaginal bleeding and it resolved, but this would be less likely. I am concerned that she does have some dark guaiac positive stool and that this may in fact have been GI blood. We are obtaining a catheterized urine specimen to evaluate for blood as well. Once the source of her blood is pinpointed, and her blood counts have returned, we will make a final disposition plan.  ----------------------------------------- 5:28 PM on 06/14/2016 -----------------------------------------  At this time, the patient has confirmed bleeding from the urethra with blood in her urine and rare bacteria but no nitrites. I am concerned about acute cystitis versus bladder or GU malignancy. This will require outpatient evaluation with urology and I'll speak with Dr. Erlene Quan, who is on-call for [place] today, about outpatient follow-up. The patient's creatinine today is 0.62, her blood counts are normal with a  hemoglobin of 14.4, and she is hemodynamically stable. The patient will complete the course of ciprofloxacin that was prescribed to her by her primary care physician. In addition, the patient does have dark stool is guaiac positive and will need GI follow-up for that as well.  I have discussed the follow-up plan with both the patient and her daughter, who understand and were able to ask questions. Return precautions were also discussed.    ____________________________________________  FINAL CLINICAL IMPRESSION(S) / ED DIAGNOSES  Final diagnoses:  Hematuria, unspecified type  Cystitis  Melena  Right lower quadrant abdominal pain  Hematuria    Clinical Course      NEW MEDICATIONS STARTED DURING THIS VISIT:  New Prescriptions   No medications on file      Anne-Caroline  Mariea Clonts, MD 06/14/16 [phone]

## 2016-06-14 NOTE — ED Notes (Signed)
Changed pt bed sheets and repositioned. Drained pt bladder.

## 2016-06-16 LAB — URINE CULTURE: CULTURE: NO GROWTH

## 2016-06-27 ENCOUNTER — Ambulatory Visit: Payer: Medicare Other | Admitting: Urology

## 2016-06-27 ENCOUNTER — Other Ambulatory Visit: Payer: Self-pay | Admitting: Radiology

## 2016-06-27 ENCOUNTER — Encounter: Payer: Self-pay | Admitting: Urology

## 2016-06-27 VITALS — BP 177/78 | HR 71 | Ht 63.0 in | Wt 144.0 lb

## 2016-06-27 DIAGNOSIS — R319 Hematuria, unspecified: Secondary | ICD-10-CM

## 2016-06-27 DIAGNOSIS — D494 Neoplasm of unspecified behavior of bladder: Secondary | ICD-10-CM

## 2016-06-27 LAB — URINALYSIS, COMPLETE
Bilirubin, UA: NEGATIVE
Glucose, UA: NEGATIVE
Ketones, UA: NEGATIVE
Leukocytes, UA: NEGATIVE
Nitrite, UA: NEGATIVE
PH UA: 5 (ref 5.0–7.5)
PROTEIN UA: NEGATIVE
Specific Gravity, UA: 1.01 (ref 1.005–1.030)
UUROB: 0.2 mg/dL (ref 0.2–1.0)

## 2016-06-27 LAB — MICROSCOPIC EXAMINATION
BACTERIA UA: NONE SEEN
WBC UA: NONE SEEN /HPF (ref 0–?)

## 2016-06-27 MED ORDER — LIDOCAINE HCL 2 % EX GEL
1.0000 "application " | Freq: Once | CUTANEOUS | Status: AC
  Administered 2016-06-27: 1 via URETHRAL

## 2016-06-27 MED ORDER — CIPROFLOXACIN HCL 500 MG PO TABS
500.0000 mg | ORAL_TABLET | Freq: Once | ORAL | Status: AC
  Administered 2016-06-27: 500 mg via ORAL

## 2016-06-27 NOTE — Progress Notes (Signed)
   06/27/16  CC:  Chief Complaint  Patient presents with  . Cysto    hematuria    HPI: 75 year old female who presents today for cystoscopy and to review the patient's CT scan. She presented initially with asymptomatic gross hematuria. Her hematuria has resolved at this time. She denies any dysuria, fevers, or chills.  The patient is otherwise quite healthy, she is able to walk 2.5 miles. She is nondiabetic without any cardiac or coronary history.  Blood pressure (!) 177/78, pulse 71, height 5\' 3"  (1.6 m), weight 65.3 kg (144 lb). NED. A&Ox3.   No respiratory distress   Abd soft, NT, ND Normal external genitalia with patent urethral meatus  Cystoscopy Procedure Note  Patient identification was confirmed, informed consent was obtained, and patient was prepped using Betadine solution.  Lidocaine jelly was administered per urethral meatus.    Preoperative abx where received prior to procedure.    Procedure: - Flexible cystoscope introduced, without any difficulty.   - Thorough search of the bladder revealed:    normal urethral meatus    normal urothelium    no stones    no ulcers     The patient had 2 small tumors on the right lateral wall just lateral to the ureteral orifice. Each tumor was approximately 1 cm in size, on a narrow stalk, low-grade appearing.    no urethral polyps    no trabeculation  - Ureteral orifices were normal in position and appearance.  Post-Procedure: - Patient tolerated the procedure well  Assessment/ Plan:   I reviewed the findings on the cystoscopic evaluation including 2 small right lateral wall bladder tumors. I explained to the patient and her daughter that this was consistent with a low-grade bladder cancer. I then described for her the surgery of transurethral resection of the bladder tumor with associated bilateral retrograde pyelograms. We also discussed postoperative mitomycin C instillation. I reviewed the procedure as well as the risks  and the benefits thereof. I explained to the patient that this was an outpatient procedure. Given the patient's otherwise very good health, she does not need cardiac clearance. Having gone through the procedure with the patient and answered all her questions and agreed to proceed. We will get this scheduled with Dr. Erlene Quan in the near future.   Ardis Hughs, MD

## 2016-06-27 NOTE — Addendum Note (Signed)
Addended by: Wilson Singer on: 06/27/2016 11:42 AM   Modules accepted: Orders

## 2016-06-28 ENCOUNTER — Telehealth: Payer: Self-pay | Admitting: Radiology

## 2016-06-28 NOTE — Telephone Encounter (Signed)
Notified pt of surgery scheduled with Dr Erlene Quan on 07/11/16, pre-admit testing appt on 06/29/16 @10 :15 & to call day prior to surgery for arrival time to SDS. Pt voices understanding.

## 2016-06-29 ENCOUNTER — Encounter
Admission: RE | Admit: 2016-06-29 | Discharge: 2016-06-29 | Disposition: A | Discharge status: Home / Self Care | Payer: Medicare Other | Source: Ambulatory Visit | Attending: Urology | Admitting: Urology

## 2016-06-29 DIAGNOSIS — I10 Essential (primary) hypertension: Secondary | ICD-10-CM | POA: Insufficient documentation

## 2016-06-29 DIAGNOSIS — Z01810 Encounter for preprocedural cardiovascular examination: Secondary | ICD-10-CM | POA: Diagnosis not present

## 2016-06-29 HISTORY — DX: Anxiety disorder, unspecified: F41.9

## 2016-06-29 NOTE — Patient Instructions (Signed)
  Your procedure is scheduled [id number]. 07/11/16 Report to Day Surgery.[place] 2nd floor To find out your arrival time please call [phone] between [place] on Mon. 07/10/16  Remember: Instructions that are not followed completely may result in serious medical risk, up to and including death, or upon the discretion of your surgeon and anesthesiologist your surgery may need to be rescheduled.    __x__ 1. Do not eat food or drink liquids after midnight. No gum chewing or hard candies.     __x__ 2. No Alcohol for 24 hours before or after surgery.   ____ 3. Do Not Smoke For 24 Hours Prior to Your Surgery.   ____ 4. Bring all medications with you on the day of surgery if instructed.    __x__ 5. Notify your doctor if there is any change in your medical condition     (cold, fever, infections).       Do not wear jewelry, make-up, hairpins, clips or nail polish.  Do not wear lotions, powders, or perfumes. You may wear deodorant.  Do not shave 48 hours prior to surgery. Men may shave face and neck.  Do not bring valuables to the hospital.    [facility] is not responsible for any belongings or valuables.               Contacts, dentures or bridgework may not be worn into surgery.  Leave your suitcase in the car. After surgery it may be brought to your room.  For patients admitted to the hospital, discharge time is determined by your                treatment team.   Patients discharged the day of surgery will not be allowed to drive home.   Please read over the following fact sheets that you were given:      _x___ Take these medicines the morning of surgery with A SIP OF WATER:    1. atenolol (TENORMIN) 50 MG tablet  2.   3.   4.  5.  6.  ____ Fleet Enema (as directed)   ____ Use CHG Soap as directed  ____ Use inhalers on the day of surgery  ____ Stop metformin 2 days prior to surgery    ____ Take 1/2 of usual insulin dose the night before surgery and none on the  morning of surgery.   __x__ Stop aspirin on 07/04/16  ___x_ Stop Anti-inflammatories on (Aleve) on 07/04/16     ___x_ Stop supplements on 07/04/16 until after surgery.    ____ Bring C-Pap to the hospital.

## 2016-06-29 NOTE — Pre-Procedure Instructions (Signed)
10 Dr. Cherrie Gauze office notified that we need an H&P before surgery.

## 2016-06-30 LAB — CULTURE, URINE COMPREHENSIVE

## 2016-07-03 ENCOUNTER — Other Ambulatory Visit: Payer: Self-pay | Admitting: Family Medicine

## 2016-07-03 DIAGNOSIS — I739 Peripheral vascular disease, unspecified: Secondary | ICD-10-CM

## 2016-07-04 ENCOUNTER — Other Ambulatory Visit: Payer: Self-pay | Admitting: Family Medicine

## 2016-07-04 ENCOUNTER — Ambulatory Visit
Admission: RE | Admit: 2016-07-04 | Discharge: 2016-07-04 | Disposition: A | Discharge status: Home / Self Care | Payer: Medicare Other | Source: Ambulatory Visit | Attending: Family Medicine | Admitting: Family Medicine

## 2016-07-04 DIAGNOSIS — I6523 Occlusion and stenosis of bilateral carotid arteries: Secondary | ICD-10-CM | POA: Insufficient documentation

## 2016-07-04 DIAGNOSIS — I739 Peripheral vascular disease, unspecified: Secondary | ICD-10-CM | POA: Diagnosis present

## 2016-07-04 DIAGNOSIS — Z1231 Encounter for screening mammogram for malignant neoplasm of breast: Secondary | ICD-10-CM

## 2016-07-07 ENCOUNTER — Other Ambulatory Visit: Payer: Medicare Other

## 2016-07-11 ENCOUNTER — Encounter
Admission: RE | Disposition: A | Discharge status: Home / Self Care | Payer: Self-pay | Source: Ambulatory Visit | Attending: Urology

## 2016-07-11 ENCOUNTER — Ambulatory Visit: Payer: Medicare Other | Admitting: Anesthesiology

## 2016-07-11 ENCOUNTER — Ambulatory Visit
Admission: RE | Admit: 2016-07-11 | Discharge: 2016-07-11 | Disposition: A | Discharge status: Home / Self Care | Payer: Medicare Other | Source: Ambulatory Visit | Attending: Urology | Admitting: Urology

## 2016-07-11 ENCOUNTER — Encounter: Payer: Self-pay | Admitting: Anesthesiology

## 2016-07-11 DIAGNOSIS — C678 Malignant neoplasm of overlapping sites of bladder: Secondary | ICD-10-CM | POA: Diagnosis not present

## 2016-07-11 DIAGNOSIS — R310 Gross hematuria: Secondary | ICD-10-CM | POA: Insufficient documentation

## 2016-07-11 DIAGNOSIS — Z87891 Personal history of nicotine dependence: Secondary | ICD-10-CM | POA: Diagnosis not present

## 2016-07-11 DIAGNOSIS — D494 Neoplasm of unspecified behavior of bladder: Secondary | ICD-10-CM

## 2016-07-11 DIAGNOSIS — Z7982 Long term (current) use of aspirin: Secondary | ICD-10-CM | POA: Diagnosis not present

## 2016-07-11 DIAGNOSIS — F419 Anxiety disorder, unspecified: Secondary | ICD-10-CM | POA: Insufficient documentation

## 2016-07-11 DIAGNOSIS — I10 Essential (primary) hypertension: Secondary | ICD-10-CM | POA: Diagnosis not present

## 2016-07-11 HISTORY — PX: TRANSURETHRAL RESECTION OF BLADDER TUMOR WITH MITOMYCIN-C: SHX6459

## 2016-07-11 SURGERY — TRANSURETHRAL RESECTION OF BLADDER TUMOR WITH MITOMYCIN-C
Anesthesia: General | Wound class: Clean Contaminated

## 2016-07-11 MED ORDER — ONDANSETRON HCL 4 MG/2ML IJ SOLN
INTRAMUSCULAR | Status: DC | PRN
  Administered 2016-07-11: 4 mg via INTRAVENOUS

## 2016-07-11 MED ORDER — FAMOTIDINE 20 MG PO TABS
ORAL_TABLET | ORAL | Status: AC
  Administered 2016-07-11: 20 mg via ORAL
  Filled 2016-07-11: qty 1

## 2016-07-11 MED ORDER — LIDOCAINE HCL (CARDIAC) 20 MG/ML IV SOLN
INTRAVENOUS | Status: DC | PRN
  Administered 2016-07-11: 40 mg via INTRAVENOUS

## 2016-07-11 MED ORDER — FENTANYL CITRATE (PF) 100 MCG/2ML IJ SOLN
INTRAMUSCULAR | Status: DC | PRN
  Administered 2016-07-11 (×2): 25 ug via INTRAVENOUS
  Administered 2016-07-11: 50 ug via INTRAVENOUS

## 2016-07-11 MED ORDER — MITOMYCIN CHEMO FOR BLADDER INSTILLATION 40 MG
40.0000 mg | Freq: Once | INTRAVENOUS | Status: AC
  Administered 2016-07-11: 40 mg via INTRAVESICAL
  Filled 2016-07-11: qty 40

## 2016-07-11 MED ORDER — PROPOFOL 10 MG/ML IV BOLUS
INTRAVENOUS | Status: DC | PRN
  Administered 2016-07-11: 150 mg via INTRAVENOUS

## 2016-07-11 MED ORDER — GLYCOPYRROLATE 0.2 MG/ML IJ SOLN
INTRAMUSCULAR | Status: DC | PRN
  Administered 2016-07-11: .2 mg via INTRAVENOUS

## 2016-07-11 MED ORDER — CIPROFLOXACIN IN D5W 400 MG/200ML IV SOLN
400.0000 mg | INTRAVENOUS | Status: AC
Start: 2016-07-11 — End: 2016-07-11
  Administered 2016-07-11: 400 mg via INTRAVENOUS

## 2016-07-11 MED ORDER — HYDROCODONE-ACETAMINOPHEN 5-325 MG PO TABS: 1.0000 | ORAL_TABLET | Freq: Four times a day (QID) | ORAL | 0 refills | Status: DC | PRN

## 2016-07-11 MED ORDER — FENTANYL CITRATE (PF) 100 MCG/2ML IJ SOLN: 25.0000 ug | INTRAMUSCULAR | Status: DC | PRN

## 2016-07-11 MED ORDER — CIPROFLOXACIN IN D5W 400 MG/200ML IV SOLN
INTRAVENOUS | Status: AC
  Administered 2016-07-11: 400 mg via INTRAVENOUS
  Filled 2016-07-11: qty 200

## 2016-07-11 MED ORDER — DEXAMETHASONE SODIUM PHOSPHATE 10 MG/ML IJ SOLN
INTRAMUSCULAR | Status: DC | PRN
  Administered 2016-07-11: 10 mg via INTRAVENOUS

## 2016-07-11 MED ORDER — ONDANSETRON HCL 4 MG/2ML IJ SOLN: 4.0000 mg | Freq: Once | INTRAMUSCULAR | Status: DC | PRN

## 2016-07-11 MED ORDER — FAMOTIDINE 20 MG PO TABS
20.0000 mg | ORAL_TABLET | Freq: Once | ORAL | Status: AC
  Administered 2016-07-11: 20 mg via ORAL

## 2016-07-11 MED ORDER — OXYBUTYNIN CHLORIDE 5 MG PO TABS: 5.0000 mg | ORAL_TABLET | Freq: Three times a day (TID) | ORAL | 0 refills | Status: DC | PRN

## 2016-07-11 MED ORDER — MIDAZOLAM HCL 2 MG/2ML IJ SOLN
INTRAMUSCULAR | Status: DC | PRN
  Administered 2016-07-11: 1 mg via INTRAVENOUS

## 2016-07-11 MED ORDER — LACTATED RINGERS IV SOLN
INTRAVENOUS | Status: DC
  Administered 2016-07-11: 09:00:00 via INTRAVENOUS
  Administered 2016-07-11: 100 mL/h via INTRAVENOUS

## 2016-07-11 SURGICAL SUPPLY — 33 items
BAG DRAIN CYSTO-URO LG1000N (MISCELLANEOUS) ×4 IMPLANT
BAG URO DRAIN 2000ML W/SPOUT (MISCELLANEOUS) ×4 IMPLANT
CATH FOLEY 2WAY  5CC 16FR (CATHETERS)
CATH FOLEY 2WAY 5CC 16FR (CATHETERS)
CATH URETL 5X70 OPEN END (CATHETERS) ×4 IMPLANT
CATH URTH 16FR FL 2W BLN LF (CATHETERS) ×1 IMPLANT
CONRAY 43 FOR UROLOGY 50M (MISCELLANEOUS) ×4 IMPLANT
CORD URO TURP 10FT (MISCELLANEOUS) ×1 IMPLANT
DRAPE UTILITY 15X26 TOWEL STRL (DRAPES) ×4 IMPLANT
ELECT LOOP 22F BIPOLAR SML (ELECTROSURGICAL)
ELECT REM PT RETURN 9FT ADLT (ELECTROSURGICAL) ×4
ELECTRODE LOOP 22F BIPOLAR SML (ELECTROSURGICAL) ×1 IMPLANT
ELECTRODE REM PT RTRN 9FT ADLT (ELECTROSURGICAL) ×1 IMPLANT
GLOVE BIO SURGEON STRL SZ 6.5 (GLOVE) ×1 IMPLANT
GLOVE BIO SURGEONS STRL SZ 6.5 (GLOVE)
GOWN STRL REUS W/ TWL LRG LVL3 (GOWN DISPOSABLE) ×4 IMPLANT
GOWN STRL REUS W/TWL LRG LVL3 (GOWN DISPOSABLE) ×8
KIT RM TURNOVER CYSTO AR (KITS) ×4 IMPLANT
LOOP CUT BIPOLAR 24F LRG (ELECTROSURGICAL) IMPLANT
PACK CYSTO AR (MISCELLANEOUS) ×4 IMPLANT
PLUG CATH AND CAP STER (CATHETERS) ×1 IMPLANT
PREP PVP WINGED SPONGE (MISCELLANEOUS) ×4 IMPLANT
SENSORWIRE 0.038 NOT ANGLED (WIRE) ×4
SET CYSTO W/LG BORE CLAMP LF (SET/KITS/TRAYS/PACK) ×1 IMPLANT
SET IRRIG Y TYPE TUR BLADDER L (SET/KITS/TRAYS/PACK) ×4 IMPLANT
SET IRRIGATING DISP (SET/KITS/TRAYS/PACK) ×4 IMPLANT
SOL .9 NS 3000ML IRR  AL (IV SOLUTION) ×2
SOL .9 NS 3000ML IRR AL (IV SOLUTION) ×2
SOL .9 NS 3000ML IRR UROMATIC (IV SOLUTION) ×2 IMPLANT
SURGILUBE 2OZ TUBE FLIPTOP (MISCELLANEOUS) ×4 IMPLANT
SYRINGE IRR TOOMEY STRL 70CC (SYRINGE) ×4 IMPLANT
WATER STERILE IRR 1000ML POUR (IV SOLUTION) ×4 IMPLANT
WIRE SENSOR 0.038 NOT ANGLED (WIRE) ×2 IMPLANT

## 2016-07-11 NOTE — Transfer of Care (Signed)
Immediate Anesthesia Transfer of Care Note  Patient: Kim Espinoza  Procedure(s) Performed: Procedure(s): TRANSURETHRAL RESECTION OF BLADDER TUMOR WITH MITOMYCIN-C (N/A)  Patient Location: PACU  Anesthesia Type:General  Level of Consciousness: sedated  Airway & Oxygen Therapy: Patient Spontanous Breathing  Post-op Assessment: Report given to RN and Post -op Vital signs reviewed and stable  Post vital signs: Reviewed and stable  Last Vitals:  Vitals:   07/11/16 0744 07/11/16 1015  BP: (!) 182/80 127/65  Pulse: 70 69  Resp: 16 11  Temp: 37 C 36.5 C    Last Pain:  Vitals:   07/11/16 0744  TempSrc: Tympanic  PainSc: 0-No pain      Patients Stated Pain Goal: 0 ([id number])  Complications: No apparent anesthesia complications

## 2016-07-11 NOTE — Anesthesia Procedure Notes (Signed)
Procedure Name: LMA Insertion Date/Time: 07/11/2016 9:30 AM Performed by: Allean Found Pre-anesthesia Checklist: Patient identified, Emergency Drugs available, Suction available, Patient being monitored and Timeout performed Patient Re-evaluated:Patient Re-evaluated prior to inductionOxygen Delivery Method: Circle system utilized Preoxygenation: Pre-oxygenation with 100% oxygen Intubation Type: IV induction Ventilation: Mask ventilation without difficulty LMA: LMA inserted LMA Size: 4.0 Number of attempts: 1 Tube secured with: Tape Dental Injury: Teeth and Oropharynx as per pre-operative assessment

## 2016-07-11 NOTE — Op Note (Signed)
Date of procedure: 07/11/16  Preoperative diagnosis:  1. Gross hematuria 2. Bladder mass 2   Postoperative diagnosis:  1. Same as above   Procedure: 1. TURBT, 2 cm 2. Instillation of intravesical mitomycin  Surgeon: [name], MD  Anesthesia: General  Complications: None  Intraoperative findings: 2 cm papillary tumor on a broad base stalk on right lateral wall of the bladder with adjacent 1 cm satellite tumor near bladder neck  EBL: Minimal  Specimens: Bladder tumor, deep base of bladder tumor  Drains: 16 French Foley catheter  Indication: Kim Espinoza is a 75 y.o. 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 She recently underwent CT urogram for further evaluation of this..  After reviewing the management options for treatment, she elected to proceed with the above surgical procedure(s). We have discussed the potential benefits and risks of the procedure, side effects of the proposed treatment, the likelihood of the patient achieving the goals of the procedure, and any potential problems that might occur during the procedure or recuperation. Informed consent has been obtained.  Description of procedure:  The patient was taken to the operating room and general anesthesia was induced.  The patient was placed in the dorsal lithotomy position, prepped and draped in the usual sterile fashion, and preoperative antibiotics were administered. A preoperative time-out was performed.   A 21 French cystoscope with 30 lens was advanced per urethra into the bladder. The bladder was carefully inspected. The trigone was normal with orthotopic UOs with clear yellow urine from both. On the right lateral wall of the bladder there was approximately 2 cm papillary tumor on a relatively broad base stalk. Adjacent to this, there is a 1 cm bladder tumor is a satellite lesion on the right lateral wall which was closer to the  bladder neck. The remainder of the bladder was relatively unremarkable without evidence of patchy erythema or additional lesions. Cold cup biopsy forceps were then used to piecewise resect both of the tumors. Once the majority of the bulk of the tumor was removed, cold cups are used at the base of the tumor to take deeper biopsies down to the level of the muscularis propria which was clearly visible.  This was passed off the field as a separate specimen labeled deep base. The bed of the tumor was then fulgurated using Bugbee electrocautery. Care was taken to avoid any fulguration and around the ureteral orifice. Once adequate hemostasis was achieved, the right UO was carefully reinspected and noted to have clear yellow urine efflux.  The bladder was then drained and the scope was removed. A 16 French Foley catheter was then placed. The patient was cleaned and dried, repositioned supine position, reversed from anesthesia, taken to the PACU in stable condition. The bladder was instilled with 40 mg of intravesical mitomycin which was allowed to dwell in the PACU for approximate one hour. She tolerated this well. After an hour, the bladder was drained and the Foley was removed.  Plan: Patient will follow-up in 1 week for pathology results review. Findings were discussed with her family. Retropyelogram was deferred today in the setting of recent retrograde pyelogram with adequate delayed imaging.  [name], M.D.

## 2016-07-11 NOTE — Anesthesia Postprocedure Evaluation (Signed)
Anesthesia Post Note  Patient: JENELLA WYSOCKI  Procedure(s) Performed: Procedure(s) (LRB): TRANSURETHRAL RESECTION OF BLADDER TUMOR WITH MITOMYCIN-C (N/A)  Patient location during evaluation: PACU Anesthesia Type: General Level of consciousness: awake and alert Pain management: pain level controlled Vital Signs Assessment: post-procedure vital signs reviewed and stable Respiratory status: spontaneous breathing, nonlabored ventilation, respiratory function stable and patient connected to nasal cannula oxygen Cardiovascular status: blood pressure returned to baseline and stable Postop Assessment: no signs of nausea or vomiting Anesthetic complications: no    Last Vitals:  Vitals:   07/11/16 1120 07/11/16 1138  BP: (!) 157/104 (!) 162/66  Pulse: 63 62  Resp: 18 18  Temp: (!) 36 C     Last Pain:  Vitals:   07/11/16 1120  TempSrc: Temporal  PainSc:                  Nuno Brubacher S

## 2016-07-11 NOTE — H&P (View-Only) (Signed)
   06/27/16  CC:  Chief Complaint  Patient presents with  . Cysto    hematuria    HPI: 75 year old female who presents today for cystoscopy and to review the patient's CT scan. She presented initially with asymptomatic gross hematuria. Her hematuria has resolved at this time. She denies any dysuria, fevers, or chills.  The patient is otherwise quite healthy, she is able to walk 2.5 miles. She is nondiabetic without any cardiac or coronary history.  Blood pressure (!) 177/78, pulse 71, height 5\' 3"  (1.6 m), weight 65.3 kg (144 lb). NED. A&Ox3.   No respiratory distress   Abd soft, NT, ND Normal external genitalia with patent urethral meatus  Cystoscopy Procedure Note  Patient identification was confirmed, informed consent was obtained, and patient was prepped using Betadine solution.  Lidocaine jelly was administered per urethral meatus.    Preoperative abx where received prior to procedure.    Procedure: - Flexible cystoscope introduced, without any difficulty.   - Thorough search of the bladder revealed:    normal urethral meatus    normal urothelium    no stones    no ulcers     The patient had 2 small tumors on the right lateral wall just lateral to the ureteral orifice. Each tumor was approximately 1 cm in size, on a narrow stalk, low-grade appearing.    no urethral polyps    no trabeculation  - Ureteral orifices were normal in position and appearance.  Post-Procedure: - Patient tolerated the procedure well  Assessment/ Plan:   I reviewed the findings on the cystoscopic evaluation including 2 small right lateral wall bladder tumors. I explained to the patient and her daughter that this was consistent with a low-grade bladder cancer. I then described for her the surgery of transurethral resection of the bladder tumor with associated bilateral retrograde pyelograms. We also discussed postoperative mitomycin C instillation. I reviewed the procedure as well as the risks  and the benefits thereof. I explained to the patient that this was an outpatient procedure. Given the patient's otherwise very good health, she does not need cardiac clearance. Having gone through the procedure with the patient and answered all her questions and agreed to proceed. We will get this scheduled with Dr. Erlene Quan in the near future.   Ardis Hughs, MD

## 2016-07-11 NOTE — Interval H&P Note (Signed)
History and Physical Interval Note:  07/11/2016 8:59 AM  Kim Espinoza  has presented today for surgery, with the diagnosis of BLADDER TUMOR  The various methods of treatment have been discussed with the patient and family. After consideration of risks, benefits and other options for treatment, the patient has consented to  Procedure(s): TRANSURETHRAL RESECTION OF BLADDER TUMOR WITH MITOMYCIN-C (N/A) CYSTOSCOPY WITH RETROGRADE PYELOGRAM (Bilateral) as a surgical intervention .  The patient's history has been reviewed, patient examined, no change in status, stable for surgery.  I have reviewed the patient's chart and labs.  Questions were answered to the patient's satisfaction.    RRR CTAB  [name]

## 2016-07-11 NOTE — Anesthesia Preprocedure Evaluation (Addendum)
Anesthesia Evaluation  Patient identified by MRN, date of birth, ID band Patient awake    Reviewed: Allergy & Precautions, NPO status , Patient's Chart, lab work & pertinent test results, reviewed documented beta blocker date and time   Airway Mallampati: II  TM Distance: >3 FB     Dental  (+) Chipped   Pulmonary former smoker,           Cardiovascular hypertension, Pt. on medications and Pt. on home beta blockers      Neuro/Psych Anxiety    GI/Hepatic   Endo/Other    Renal/GU      Musculoskeletal   Abdominal   Peds  Hematology   Anesthesia Other Findings Hypertensive this am. EKG reviewed and ok.  Reproductive/Obstetrics                            Anesthesia Physical Anesthesia Plan  ASA: III  Anesthesia Plan: General   Post-op Pain Management:    Induction: Intravenous  Airway Management Planned: Oral ETT and LMA  Additional Equipment:   Intra-op Plan:   Post-operative Plan:   Informed Consent: I have reviewed the patients History and Physical, chart, labs and discussed the procedure including the risks, benefits and alternatives for the proposed anesthesia with the patient or authorized representative who has indicated his/her understanding and acceptance.     Plan Discussed with: CRNA  Anesthesia Plan Comments:         Anesthesia Quick Evaluation

## 2016-07-11 NOTE — Discharge Instructions (Signed)
AMBULATORY SURGERY  DISCHARGE INSTRUCTIONS   1) The drugs that you were given will stay in your system until tomorrow so for the next 24 hours you should not:  A) Drive an automobile B) Make any legal decisions C) Drink any alcoholic beverage   2) You may resume regular meals tomorrow.  Today it is better to start with liquids and gradually work up to solid foods.  You may eat anything you prefer, but it is better to start with liquids, then soup and crackers, and gradually work up to solid foods.   3) Please notify your doctor immediately if you have any unusual bleeding, trouble breathing, redness and pain at the surgery site, drainage, fever, or pain not relieved by medication.    4) Additional Instructions:        Please contact your physician with any problems or Same Day Surgery at [phone], Monday through Friday 6 am to 4 pm, or [place] at [facility] number at [phone].Transurethral Resection of Bladder Tumor (TURBT) or Bladder Biopsy   Definition:  Transurethral Resection of the Bladder Tumor is a surgical procedure used to diagnose and remove tumors within the bladder. TURBT is the most common treatment for early stage bladder cancer.  General instructions:     Your recent bladder surgery requires very little post hospital care but some definite precautions.  Despite the fact that no skin incisions were used, the area around the bladder incisions are raw and covered with scabs to promote healing and prevent bleeding. Certain precautions are needed to insure that the scabs are not disturbed over the next 2-4 weeks while the healing proceeds.  Because the raw surface inside your bladder and the irritating effects of urine you may expect frequency of urination and/or urgency (a stronger desire to urinate) and perhaps even getting up at night more often. This will usually resolve or improve slowly over the healing period. You may see some blood in your  urine over the first 6 weeks. Do not be alarmed, even if the urine was clear for a while. Get off your feet and drink lots of fluids until clearing occurs. If you start to pass clots or don't improve call us.  Diet:  You may return to your normal diet immediately. Because of the raw surface of your bladder, alcohol, spicy foods, foods high in acid and drinks with caffeine may cause irritation or frequency and should be used in moderation. To keep your urine flowing freely and avoid constipation, drink plenty of fluids during the day (8-10 glasses). Tip: Avoid cranberry juice because it is very acidic.  Activity:  Your physical activity doesn't need to be restricted. However, if you are very active, you may see some blood in the urine. We suggest that you reduce your activity under the circumstances until the bleeding has stopped.  Bowels:  It is important to keep your bowels regular during the postoperative period. Straining with bowel movements can cause bleeding. A bowel movement every other day is reasonable. Use a mild laxative if needed, such as milk of magnesia 2-3 tablespoons, or 2 Dulcolax tablets. Call if you continue to have problems. If you had been taking narcotics for pain, before, during or after your surgery, you may be constipated. Take a laxative if necessary.    Medication:  You should resume your pre-surgery medications unless told not to. In addition you may be given an antibiotic to prevent or treat infection. Antibiotics are not always necessary. All medication should  be taken as prescribed until the bottles are finished unless you are having an unusual reaction to one of the drugs.   [place] [address] [place], [place] [address] [phone]

## 2016-07-12 ENCOUNTER — Encounter: Payer: Self-pay | Admitting: Urology

## 2016-07-17 LAB — SURGICAL PATHOLOGY

## 2016-07-19 ENCOUNTER — Encounter: Payer: Self-pay | Admitting: Urology

## 2016-07-19 ENCOUNTER — Ambulatory Visit (INDEPENDENT_AMBULATORY_CARE_PROVIDER_SITE_OTHER): Payer: Medicare Other | Admitting: Urology

## 2016-07-19 DIAGNOSIS — M797 Fibromyalgia: Secondary | ICD-10-CM | POA: Insufficient documentation

## 2016-07-19 DIAGNOSIS — C678 Malignant neoplasm of overlapping sites of bladder: Secondary | ICD-10-CM | POA: Diagnosis not present

## 2016-07-19 DIAGNOSIS — F17200 Nicotine dependence, unspecified, uncomplicated: Secondary | ICD-10-CM | POA: Insufficient documentation

## 2016-07-19 DIAGNOSIS — R102 Pelvic and perineal pain: Secondary | ICD-10-CM | POA: Insufficient documentation

## 2016-07-19 DIAGNOSIS — R091 Pleurisy: Secondary | ICD-10-CM | POA: Insufficient documentation

## 2016-07-19 DIAGNOSIS — I779 Disorder of arteries and arterioles, unspecified: Secondary | ICD-10-CM | POA: Insufficient documentation

## 2016-07-19 DIAGNOSIS — I739 Peripheral vascular disease, unspecified: Secondary | ICD-10-CM

## 2016-07-19 DIAGNOSIS — Z780 Asymptomatic menopausal state: Secondary | ICD-10-CM | POA: Insufficient documentation

## 2016-07-19 NOTE — Progress Notes (Signed)
07/19/2016 4:03 PM   Kim Espinoza February 21, 1941 [id number]  Referring provider: [name], MD [address] [facility], [place] [address]  Chief Complaint  Patient presents with  . Follow-up    Pathology results    HPI: The patient presents today for follow-up after undergoing a transurethral resection of bladder tumor in the operating room. She had a 2 cm papular tumor on the right lateral wall with adjacent 1 cm satellite tumor near the bladder neck. These were found during hematuria workup that was otherwise negative. 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 It was indeterminate if there was muscularis propria in the specimen.  However, during the surgery stent was seen in the bed of the resection site.  Since surgery she has done well. She is voiding without complaints. She does not have hematuria.   PMH: Past Medical History:  Diagnosis Date  . Abnormal mammogram 2011, 2012   Right side.   . Anxiety   . Breast screening, unspecified   . Hypercholesterolemia since 2001  . Hypertension    since approx. 2001  . Mammographic microcalcification   . Personal history of tobacco use, presenting hazards to health   . Special screening for malignant neoplasms, colon   . 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 BREAST BIOPSY Right ??   x2 - core w/clip - neg  . BREAST BIOPSY-right  2011   Stereotactic - Dr. Bary Castilla- Benign breast tissue containing stromal calcifications wiithin.  Periductal Fibrosis.  Marland Kitchen BREAST SURGERY Right 2011, 2013   stero biopsy  . CAROTID ENDARTERECTOMY  2004  . CATARACT EXTRACTION EXTRACAPSULAR  2013   Left eye  . COLONOSCOPY  2012   Dr. Bary Castilla  . EYE SURGERY Left    cataract   . TRANSURETHRAL RESECTION OF BLADDER TUMOR WITH MITOMYCIN-C N/A 07/11/2016   Procedure: TRANSURETHRAL RESECTION OF BLADDER TUMOR WITH MITOMYCIN-C;   Surgeon: [name], MD;  Location: [facility] ORS;  Service: Urology;  Laterality: N/A;  . TUBAL LIGATION    . VASCULAR SURGERY Left 2004   carotid endarderectomy    Home Medications:    Medication List       Accurate as of 07/19/16  4:03 PM. Always use your most recent med list.          amLODipine 5 MG tablet Commonly known as:  NORVASC Take 5 mg by mouth once. Am of surgery   aspirin EC 81 MG tablet Take 81 mg by mouth every evening.   atenolol 50 MG tablet Commonly known as:  TENORMIN Take 50 mg by mouth 2 (two) times daily.   B-complex with vitamin C tablet Take 1 tablet by mouth every evening.   Flaxseed Oil 1000 MG Caps Take 1,000 mg by mouth every evening.   HYDROcodone-acetaminophen 5-325 MG tablet Commonly known as:  NORCO/VICODIN Take 1-2 tablets by mouth every 6 (six) hours as needed for moderate pain.   nortriptyline 25 MG capsule Commonly known as:  PAMELOR Take 25 mg by mouth at bedtime.   oxybutynin 5 MG tablet Commonly known as:  DITROPAN Take 1 tablet (5 mg total) by mouth every 8 (eight) hours as needed for bladder spasms.   simvastatin 10 MG tablet Commonly known as:  ZOCOR Take 10 mg by mouth at bedtime.   vitamin B-12 250 MCG tablet Commonly known as:  CYANOCOBALAMIN Take 250 mcg by mouth  every evening.   vitamin B-6 250 MG tablet Take 250 mg by mouth every evening.       Allergies: No Known Allergies  Family History: Family History  Problem Relation Age of Onset  . Cancer Father     lung cancer - deceased  . Cancer Sister 44    breast cancer - alive and well  . Breast cancer Sister   . Breast cancer Paternal Aunt   . Colon cancer Neg Hx   . Ovarian cancer Neg Hx   . Hematuria Neg Hx   . Prostate cancer Neg Hx     Social History:  reports that she quit smoking about 10 years ago. She has never used smokeless tobacco. She reports that she does not drink alcohol or use drugs.  ROS: UROLOGY Frequent Urination?: No Hard  to postpone urination?: No Burning/pain with urination?: No Get up at night to urinate?: No Leakage of urine?: No Urine stream starts and stops?: No Trouble starting stream?: No Do you have to strain to urinate?: No Blood in urine?: No Urinary tract infection?: No Sexually transmitted disease?: No Injury to kidneys or bladder?: No Painful intercourse?: No Weak stream?: No Currently pregnant?: No Vaginal bleeding?: No Last menstrual period?: n  Gastrointestinal Nausea?: No Vomiting?: No Indigestion/heartburn?: No Diarrhea?: No Constipation?: No  Constitutional Fever: No Night sweats?: No Weight loss?: No Fatigue?: No  Skin Skin rash/lesions?: No Itching?: No  Eyes Blurred vision?: No Double vision?: No  Ears/Nose/Throat Sore throat?: No Sinus problems?: No  Hematologic/Lymphatic Swollen glands?: No Easy bruising?: No  Cardiovascular Leg swelling?: No Chest pain?: No  Respiratory Cough?: No Shortness of breath?: No  Endocrine Excessive thirst?: No  Musculoskeletal Back pain?: No Joint pain?: No  Neurological Headaches?: No Dizziness?: No  Psychologic Depression?: No Anxiety?: No  Physical Exam: There were no vitals taken for this visit.  Constitutional:  Alert and oriented, No acute distress. HEENT: [place] AT, moist mucus membranes.  Trachea midline, no masses. Cardiovascular: No clubbing, cyanosis, or edema. Respiratory: Normal respiratory effort, no increased work of breathing. GI: Abdomen is soft, nontender, nondistended, no abdominal masses GU: No CVA tenderness.  Skin: No rashes, bruises or suspicious lesions. Lymph: No cervical or inguinal adenopathy. Neurologic: Grossly intact, no focal deficits, moving all 4 extremities. Psychiatric: Normal mood and affect.  Laboratory Data: Lab Results  Component Value Date   WBC 11.2 (H) 06/14/2016   HGB 14.4 06/14/2016   HCT 41.7 06/14/2016   MCV 91.1 06/14/2016   PLT 208 06/14/2016     Lab Results  Component Value Date   CREATININE 0.62 06/14/2016    No results found for: PSA  No results found for: TESTOSTERONE  No results found for: HGBA1C  Urinalysis    Component Value Date/Time   COLORURINE RED (A) 06/14/2016 1535   APPEARANCEUR Clear 06/27/2016 1035   LABSPEC 1.001 (L) 06/14/2016 1535   PHURINE 7.0 06/14/2016 1535   GLUCOSEU Negative 06/27/2016 1035   HGBUR 3+ (A) 06/14/2016 1535   BILIRUBINUR Negative 06/27/2016 [place] 06/14/2016 1535   PROTEINUR Negative 06/27/2016 1035   PROTEINUR 100 (A) 06/14/2016 1535   NITRITE Negative 06/27/2016 1035   NITRITE NEGATIVE 06/14/2016 1535   LEUKOCYTESUR Negative 06/27/2016 1035      Assessment & Plan:  1. Bladder Cancer I discussed with the patient her new diagnosis of high-risk bladder cancer. We discussed that for high pT1 TCC that repeat resection at the site of the tumor base to  confirm no invasion into the muscularis propria is that standard of care. The patient would like to wait until after the holidays as she is very busy now. I do not think this is unreasonable. We did briefly discuss the next step if repeat resection confirms no invasion into the muscular layer. This would be induction BCG. We discussed how this is performed and its goal to decrease recurrence and progression. She understands the risks of this procedure which include urinary tract infection type symptoms as well as a small chance of sepsis. All questions were answered. The patient and her daughter are agreeable to proceeding with repeat resection.   [name], MD  [facility] [address] [place], [place] [address] [phone]

## 2016-07-20 ENCOUNTER — Other Ambulatory Visit: Payer: Self-pay | Admitting: Radiology

## 2016-07-20 DIAGNOSIS — C679 Malignant neoplasm of bladder, unspecified: Secondary | ICD-10-CM

## 2016-08-01 ENCOUNTER — Telehealth: Payer: Self-pay | Admitting: Radiology

## 2016-08-01 NOTE — Telephone Encounter (Signed)
LMOM. Need to discuss surgery information. 

## 2016-08-01 NOTE — Telephone Encounter (Signed)
Notified pt's daughter, Vedia Pereyra, of pt's surgery scheduled with Dr Pilar Jarvis on 08/30/16, pre-admit testing appt on 08/16/16 @10 :45 & to call day prior to surgery for arrival time to SDS. Advised pt to hold ASA 81mg  beginning 08/23/16. [name] voices understanding.

## 2016-08-09 ENCOUNTER — Ambulatory Visit: Payer: Medicare Other

## 2016-08-16 DIAGNOSIS — Z01812 Encounter for preprocedural laboratory examination: Secondary | ICD-10-CM | POA: Insufficient documentation

## 2016-08-16 LAB — BASIC METABOLIC PANEL
ANION GAP: 5 (ref 5–15)
BUN: 8 mg/dL (ref 6–20)
CHLORIDE: 105 mmol/L (ref 101–111)
CO2: 29 mmol/L (ref 22–32)
Calcium: 10 mg/dL (ref 8.9–10.3)
Creatinine, Ser: 0.59 mg/dL (ref 0.44–1.00)
GFR calc non Af Amer: >60 mL/min (ref >60)
Glucose, Bld: 101 mg/dL — ABNORMAL HIGH (ref 65–99)
POTASSIUM: 3.9 mmol/L (ref 3.5–5.1)
Sodium: 139 mmol/L (ref 135–145)

## 2016-08-16 LAB — PROTIME-INR
INR: 0.96
Prothrombin Time: 12.8 seconds (ref 11.4–15.2)

## 2016-08-16 LAB — APTT: aPTT: <24 seconds — ABNORMAL LOW (ref 24–36)

## 2016-08-16 NOTE — Pre-Procedure Instructions (Addendum)
Received call from lab. The purple tube clotted.  The patient notified of need to redraw blood. Coming in on 08/17/16 for repeat blood draw.

## 2016-08-16 NOTE — Patient Instructions (Addendum)
  Your procedure is scheduled on: 08/30/16 Wed Report to Same Day Surgery 2nd floor medical mall [facility] Entrance-take elevator on left to 2nd floor.  Check in with surgery information desk.) To find out your arrival time please call [phone] between 1PM - 3PM on 08/29/16 Tues  Remember: Instructions that are not followed completely may result in serious medical risk, up to and including death, or upon the discretion of your surgeon and anesthesiologist your surgery may need to be rescheduled.    _x___ 1. Do not eat food or drink liquids after midnight. No gum chewing or hard candies.     __x__ 2. No Alcohol for 24 hours before or after surgery.   __x__3. No Smoking for 24 prior to surgery.   ____  4. Bring all medications with you on the day of surgery if instructed.    __x__ 5. Notify your doctor if there is any change in your medical condition     (cold, fever, infections).     Do not wear jewelry, make-up, hairpins, clips or nail polish.  Do not wear lotions, powders, or perfumes. You may wear deodorant.  Do not shave 48 hours prior to surgery. Men may shave face and neck.  Do not bring valuables to the hospital.    [facility] is not responsible for any belongings or valuables.               Contacts, dentures or bridgework may not be worn into surgery.  Leave your suitcase in the car. After surgery it may be brought to your room.  For patients admitted to the hospital, discharge time is determined by your treatment team.   Patients discharged the day of surgery will not be allowed to drive home.  You will need someone to drive you home and stay with you the night of your procedure.    Please read over the following fact sheets that you were given:   [facility] Preparing for Surgery and or MRSA Information   _x___ Take these medicines the morning of surgery with A SIP OF WATER:    1. atenolol (TENORMIN  2.simvastatin (ZOCOR  3.  4.  5.  6.  ____Fleets enema or  Magnesium Citrate as directed.   _x___ Use CHG Soap or sage wipes as directed on instruction sheet   ____ Use inhalers on the day of surgery and bring to hospital day of surgery  ____ Stop metformin 2 days prior to surgery    ____ Take 1/2 of usual insulin dose the night before surgery and none on the morning of           surgery.   ____ Stop Aspirin, Coumadin, Pllavix ,Eliquis, Effient, or Pradaxa  x__ Stop Anti-inflammatories such as Advil, Aleve, Ibuprofen, Motrin, Naproxen,          Naprosyn, Goodies powders or aspirin products. Ok to take Tylenol.   _x___ Stop supplements until after surgery.  Stopped supplements a week ago  ____ Bring C-Pap to the hospital.

## 2016-08-17 ENCOUNTER — Encounter
Admission: RE | Admit: 2016-08-17 | Discharge: 2016-08-17 | Disposition: A | Discharge status: Home / Self Care | Payer: Medicare Other | Source: Ambulatory Visit | Attending: Urology | Admitting: Urology

## 2016-08-17 DIAGNOSIS — Z01812 Encounter for preprocedural laboratory examination: Secondary | ICD-10-CM | POA: Insufficient documentation

## 2016-08-17 HISTORY — DX: Malignant (primary) neoplasm, unspecified: C80.1

## 2016-08-17 LAB — CBC
HEMATOCRIT: 39.4 % (ref 35.0–47.0)
HEMOGLOBIN: 13.3 g/dL (ref 12.0–16.0)
MCH: 31 pg (ref 26.0–34.0)
MCHC: 33.7 g/dL (ref 32.0–36.0)
MCV: 92 fL (ref 80.0–100.0)
Platelets: 197 10*3/uL (ref 150–440)
RBC: 4.28 MIL/uL (ref 3.80–5.20)
RDW: 13.8 % (ref 11.5–14.5)
WBC: 7 10*3/uL (ref 3.6–11.0)

## 2016-08-17 LAB — URINE CULTURE

## 2016-08-21 DIAGNOSIS — C801 Malignant (primary) neoplasm, unspecified: Secondary | ICD-10-CM

## 2016-08-21 HISTORY — DX: Malignant (primary) neoplasm, unspecified: C80.1

## 2016-08-24 ENCOUNTER — Ambulatory Visit: Payer: Medicare Other

## 2016-08-30 ENCOUNTER — Ambulatory Visit
Admission: RE | Admit: 2016-08-30 | Discharge: 2016-08-30 | Disposition: A | Discharge status: Home / Self Care | Payer: Medicare Other | Source: Ambulatory Visit | Attending: Urology | Admitting: Urology

## 2016-08-30 ENCOUNTER — Telehealth: Payer: Self-pay | Admitting: Urology

## 2016-08-30 ENCOUNTER — Ambulatory Visit: Payer: Medicare Other | Admitting: Anesthesiology

## 2016-08-30 ENCOUNTER — Encounter
Admission: RE | Disposition: A | Discharge status: Home / Self Care | Payer: Self-pay | Source: Ambulatory Visit | Attending: Urology

## 2016-08-30 DIAGNOSIS — C679 Malignant neoplasm of bladder, unspecified: Secondary | ICD-10-CM

## 2016-08-30 DIAGNOSIS — F419 Anxiety disorder, unspecified: Secondary | ICD-10-CM | POA: Insufficient documentation

## 2016-08-30 DIAGNOSIS — Z79899 Other long term (current) drug therapy: Secondary | ICD-10-CM | POA: Diagnosis not present

## 2016-08-30 DIAGNOSIS — E7800 Pure hypercholesterolemia, unspecified: Secondary | ICD-10-CM | POA: Diagnosis not present

## 2016-08-30 DIAGNOSIS — I10 Essential (primary) hypertension: Secondary | ICD-10-CM | POA: Insufficient documentation

## 2016-08-30 DIAGNOSIS — Z7982 Long term (current) use of aspirin: Secondary | ICD-10-CM | POA: Diagnosis not present

## 2016-08-30 DIAGNOSIS — Z87891 Personal history of nicotine dependence: Secondary | ICD-10-CM | POA: Diagnosis not present

## 2016-08-30 DIAGNOSIS — M797 Fibromyalgia: Secondary | ICD-10-CM | POA: Diagnosis not present

## 2016-08-30 DIAGNOSIS — I739 Peripheral vascular disease, unspecified: Secondary | ICD-10-CM | POA: Diagnosis not present

## 2016-08-30 HISTORY — PX: TRANSURETHRAL RESECTION OF BLADDER TUMOR: SHX2575

## 2016-08-30 SURGERY — TURBT (TRANSURETHRAL RESECTION OF BLADDER TUMOR)
Anesthesia: General | Site: Bladder | Wound class: Clean Contaminated

## 2016-08-30 MED ORDER — LIDOCAINE 2% (20 MG/ML) 5 ML SYRINGE
INTRAMUSCULAR | Status: AC
  Filled 2016-08-30: qty 5

## 2016-08-30 MED ORDER — ONDANSETRON HCL 4 MG/2ML IJ SOLN: 4.0000 mg | Freq: Once | INTRAMUSCULAR | Status: DC | PRN

## 2016-08-30 MED ORDER — LIDOCAINE HCL (CARDIAC) 20 MG/ML IV SOLN
INTRAVENOUS | Status: DC | PRN
  Administered 2016-08-30: 60 mg via INTRAVENOUS

## 2016-08-30 MED ORDER — CEPHALEXIN 500 MG PO CAPS: 500.0000 mg | ORAL_CAPSULE | Freq: Three times a day (TID) | ORAL | 0 refills | Status: DC

## 2016-08-30 MED ORDER — PHENYLEPHRINE 40 MCG/ML (10ML) SYRINGE FOR IV PUSH (FOR BLOOD PRESSURE SUPPORT)
PREFILLED_SYRINGE | INTRAVENOUS | Status: AC
  Filled 2016-08-30: qty 10

## 2016-08-30 MED ORDER — FENTANYL CITRATE (PF) 100 MCG/2ML IJ SOLN
INTRAMUSCULAR | Status: DC | PRN
  Administered 2016-08-30 (×2): 25 ug via INTRAVENOUS

## 2016-08-30 MED ORDER — HYDROCODONE-ACETAMINOPHEN 5-325 MG PO TABS: 1.0000 | ORAL_TABLET | ORAL | 0 refills | Status: DC | PRN

## 2016-08-30 MED ORDER — EPHEDRINE 5 MG/ML INJ
INTRAVENOUS | Status: AC
  Filled 2016-08-30: qty 10

## 2016-08-30 MED ORDER — PROPOFOL 10 MG/ML IV BOLUS
INTRAVENOUS | Status: DC | PRN
  Administered 2016-08-30: 150 mg via INTRAVENOUS

## 2016-08-30 MED ORDER — PHENYLEPHRINE HCL 10 MG/ML IJ SOLN
INTRAMUSCULAR | Status: DC | PRN
  Administered 2016-08-30: 80 ug via INTRAVENOUS

## 2016-08-30 MED ORDER — MIDAZOLAM HCL 2 MG/2ML IJ SOLN
INTRAMUSCULAR | Status: AC
  Filled 2016-08-30: qty 2

## 2016-08-30 MED ORDER — ONDANSETRON HCL 4 MG/2ML IJ SOLN
INTRAMUSCULAR | Status: DC | PRN
  Administered 2016-08-30: 4 mg via INTRAVENOUS

## 2016-08-30 MED ORDER — CEFAZOLIN SODIUM-DEXTROSE 2-4 GM/100ML-% IV SOLN
INTRAVENOUS | Status: AC
  Administered 2016-08-30: 2 g via INTRAVENOUS
  Filled 2016-08-30: qty 100

## 2016-08-30 MED ORDER — DEXAMETHASONE SODIUM PHOSPHATE 10 MG/ML IJ SOLN
INTRAMUSCULAR | Status: DC | PRN
  Administered 2016-08-30: 5 mg via INTRAVENOUS

## 2016-08-30 MED ORDER — LACTATED RINGERS IV SOLN
INTRAVENOUS | Status: DC
  Administered 2016-08-30: 12:00:00 via INTRAVENOUS

## 2016-08-30 MED ORDER — CEFAZOLIN SODIUM-DEXTROSE 2-4 GM/100ML-% IV SOLN
2.0000 g | INTRAVENOUS | Status: AC
  Administered 2016-08-30: 2 g via INTRAVENOUS

## 2016-08-30 MED ORDER — EPHEDRINE SULFATE 50 MG/ML IJ SOLN
INTRAMUSCULAR | Status: DC | PRN
  Administered 2016-08-30: 10 mg via INTRAVENOUS

## 2016-08-30 MED ORDER — PROPOFOL 10 MG/ML IV BOLUS
INTRAVENOUS | Status: AC
  Filled 2016-08-30: qty 20

## 2016-08-30 MED ORDER — FAMOTIDINE 20 MG PO TABS
ORAL_TABLET | ORAL | Status: AC
  Administered 2016-08-30: 20 mg via ORAL
  Filled 2016-08-30: qty 1

## 2016-08-30 MED ORDER — DEXAMETHASONE SODIUM PHOSPHATE 10 MG/ML IJ SOLN
INTRAMUSCULAR | Status: AC
  Filled 2016-08-30: qty 1

## 2016-08-30 MED ORDER — FENTANYL CITRATE (PF) 100 MCG/2ML IJ SOLN
INTRAMUSCULAR | Status: AC
  Filled 2016-08-30: qty 2

## 2016-08-30 MED ORDER — MIDAZOLAM HCL 5 MG/5ML IJ SOLN
INTRAMUSCULAR | Status: DC | PRN
  Administered 2016-08-30: 1 mg via INTRAVENOUS

## 2016-08-30 MED ORDER — ONDANSETRON HCL 4 MG/2ML IJ SOLN
INTRAMUSCULAR | Status: AC
  Filled 2016-08-30: qty 2

## 2016-08-30 MED ORDER — FENTANYL CITRATE (PF) 100 MCG/2ML IJ SOLN: 25.0000 ug | INTRAMUSCULAR | Status: DC | PRN

## 2016-08-30 MED ORDER — FAMOTIDINE 20 MG PO TABS
20.0000 mg | ORAL_TABLET | Freq: Once | ORAL | Status: AC
  Administered 2016-08-30: 20 mg via ORAL

## 2016-08-30 SURGICAL SUPPLY — 27 items
BACTOSHIELD CHG 4% 4OZ (MISCELLANEOUS) ×2
BAG DRAIN CYSTO-URO LG1000N (MISCELLANEOUS) ×3 IMPLANT
BAG URO DRAIN 2000ML W/SPOUT (MISCELLANEOUS) IMPLANT
CATH FOL LEG HOLDER (MISCELLANEOUS) IMPLANT
CATH FOLEY 2WAY  5CC 16FR (CATHETERS)
CATH FOLEY 2WAY 5CC 16FR (CATHETERS)
CATH FOLEY 3WAY 30CC 24FR (CATHETERS)
CATH URTH 16FR FL 2W BLN LF (CATHETERS) IMPLANT
CATH URTH STD 24FR FL 3W 2 (CATHETERS) IMPLANT
ELECT LOOP 22F BIPOLAR SML (ELECTROSURGICAL) ×3
ELECT REM PT RETURN 9FT ADLT (ELECTROSURGICAL) ×3
ELECTRODE LOOP 22F BIPOLAR SML (ELECTROSURGICAL) ×1 IMPLANT
ELECTRODE REM PT RTRN 9FT ADLT (ELECTROSURGICAL) ×1 IMPLANT
EVACUATOR ELLICK (MISCELLANEOUS) ×1 IMPLANT
GLOVE BIO SURGEON STRL SZ7.5 (GLOVE) ×3 IMPLANT
GOWN STRL REUS W/ TWL LRG LVL3 (GOWN DISPOSABLE) ×2 IMPLANT
GOWN STRL REUS W/TWL LRG LVL3 (GOWN DISPOSABLE) ×6
KIT RM TURNOVER CYSTO AR (KITS) ×3 IMPLANT
LOOP CUT BIPOLAR 24F LRG (ELECTROSURGICAL) IMPLANT
PACK CYSTO AR (MISCELLANEOUS) ×3 IMPLANT
SCRUB CHG 4% DYNA-HEX 4OZ (MISCELLANEOUS) ×1 IMPLANT
SET IRRIG Y TYPE TUR BLADDER L (SET/KITS/TRAYS/PACK) ×3 IMPLANT
SOL .9 NS 3000ML IRR  AL (IV SOLUTION) ×4
SOL .9 NS 3000ML IRR AL (IV SOLUTION) ×2
SOL .9 NS 3000ML IRR UROMATIC (IV SOLUTION) ×2 IMPLANT
SURGILUBE 2OZ TUBE FLIPTOP (MISCELLANEOUS) ×3 IMPLANT
WATER STERILE IRR 1000ML POUR (IV SOLUTION) ×3 IMPLANT

## 2016-08-30 NOTE — Addendum Note (Signed)
Addendum  created 08/30/16 1636 by Dionne Bucy, CRNA   Anesthesia Intra Meds edited

## 2016-08-30 NOTE — Telephone Encounter (Signed)
done

## 2016-08-30 NOTE — Discharge Instructions (Signed)
AMBULATORY SURGERY  °DISCHARGE INSTRUCTIONS ° ° °1) The drugs that you were given will stay in your system until tomorrow so for the next 24 hours you should not: ° °A) Drive an automobile °B) Make any legal decisions °C) Drink any alcoholic beverage ° ° °2) You may resume regular meals tomorrow.  Today it is better to start with liquids and gradually work up to solid foods. ° °You may eat anything you prefer, but it is better to start with liquids, then soup and crackers, and gradually work up to solid foods. ° ° °3) Please notify your doctor immediately if you have any unusual bleeding, trouble breathing, redness and pain at the surgery site, drainage, fever, or pain not relieved by medication. ° ° ° °4) Additional Instructions: ° ° ° ° ° ° ° °Please contact your physician with any problems or Same Day Surgery at [phone], Monday through Friday 6 am to 4 pm, or [place] at [place] [facility] [facility] number at [phone]. °

## 2016-08-30 NOTE — Anesthesia Postprocedure Evaluation (Signed)
Anesthesia Post Note  Patient: Kim Espinoza  Procedure(s) Performed: Procedure(s) (LRB): TRANSURETHRAL RESECTION OF BLADDER TUMOR (TURBT) SMALL (N/A)  Patient location during evaluation: PACU Anesthesia Type: General Level of consciousness: awake and alert and oriented Pain management: pain level controlled Vital Signs Assessment: post-procedure vital signs reviewed and stable Respiratory status: spontaneous breathing Cardiovascular status: blood pressure returned to baseline Anesthetic complications: no     Last Vitals:  Vitals:   08/30/16 1510 08/30/16 1533  BP: (!) 161/72 (!) 152/68  Pulse: 66 61  Resp: 16   Temp: 37 C     Last Pain:  Vitals:   08/30/16 1510  TempSrc: Temporal  PainSc:                  Chelsia Serres

## 2016-08-30 NOTE — Anesthesia Preprocedure Evaluation (Signed)
Anesthesia Evaluation  Patient identified by MRN, date of birth, ID band Patient awake    Reviewed: Allergy & Precautions, NPO status , Patient's Chart, lab work & pertinent test results, reviewed documented beta blocker date and time   Airway Mallampati: II  TM Distance: >3 FB     Dental  (+) Chipped   Pulmonary former smoker,    Pulmonary exam normal        Cardiovascular hypertension, Pt. on medications and Pt. on home beta blockers + Peripheral Vascular Disease  Normal cardiovascular exam     Neuro/Psych Anxiety    GI/Hepatic negative GI ROS, Neg liver ROS,   Endo/Other  negative endocrine ROS  Renal/GU negative Renal ROS Bladder dysfunction      Musculoskeletal  (+) Fibromyalgia -  Abdominal Normal abdominal exam  (+)   Peds  Hematology negative hematology ROS (+)   Anesthesia Other Findings Hypertensive this am. EKG reviewed and ok.  Reproductive/Obstetrics                             Anesthesia Physical  Anesthesia Plan  ASA: III  Anesthesia Plan: General   Post-op Pain Management:    Induction: Intravenous  Airway Management Planned: LMA  Additional Equipment:   Intra-op Plan:   Post-operative Plan: Extubation in OR  Informed Consent: I have reviewed the patients History and Physical, chart, labs and discussed the procedure including the risks, benefits and alternatives for the proposed anesthesia with the patient or authorized representative who has indicated his/her understanding and acceptance.     Plan Discussed with: CRNA and Surgeon  Anesthesia Plan Comments:         Anesthesia Quick Evaluation

## 2016-08-30 NOTE — Anesthesia Procedure Notes (Signed)
Procedure Name: LMA Insertion Date/Time: 08/30/2016 2:03 PM Performed by: Dionne Bucy Pre-anesthesia Checklist: Patient identified, Patient being monitored, Timeout performed, Emergency Drugs available and Suction available Patient Re-evaluated:Patient Re-evaluated prior to inductionOxygen Delivery Method: Circle system utilized Preoxygenation: Pre-oxygenation with 100% oxygen Intubation Type: IV induction Ventilation: Mask ventilation without difficulty LMA: LMA inserted LMA Size: 4.0 Tube type: Oral Number of attempts: 1 Placement Confirmation: positive ETCO2 and breath sounds checked- equal and bilateral Tube secured with: Tape Dental Injury: Teeth and Oropharynx as per pre-operative assessment

## 2016-08-30 NOTE — Progress Notes (Signed)
Pt voided

## 2016-08-30 NOTE — Op Note (Signed)
Date of procedure: 08/30/16  Preoperative diagnosis:  1. Bladder cancer   Postoperative diagnosis:  1. Bladder cancer   Procedure: 1. Transurethral resection of bladder tumor 3 cm  Surgeon: Baruch Gouty, MD  Anesthesia: General  Complications: None  Intraoperative findings: The base of the previous bladder tumor on the right lateral wall was re-resected. There was fat visible endoscopically showing good resection of the muscle base. The base the bladder tumor sent separately from the remainder of the specimens.  EBL: None  Specimens: Base of bladder tumor and bladder tumor  Drains: None  Disposition: Stable to the postanesthesia care unit  Indication for procedure: The patient is a 76 y.o. female with previous TURBT for pT1a TCC of the bladder who presents for re-resection of the tumor base..  After reviewing the management options for treatment, the patient elected to proceed with the above surgical procedure(s). We have discussed the potential benefits and risks of the procedure, side effects of the proposed treatment, the likelihood of the patient achieving the goals of the procedure, and any potential problems that might occur during the procedure or recuperation. Informed consent has been obtained.  Description of procedure: The patient was met in the preoperative area. All risks, benefits, and indications of the procedure were described in great detail. The patient consented to the procedure. Preoperative antibiotics were given. The patient was taken to the operative theater. General anesthesia was induced per the anesthesia service. The patient was then placed in the dorsal lithotomy position and prepped and draped in the usual sterile fashion. A preoperative timeout was called.   A 24 French resectoscope obturator was placed in the Engineer, maintenance (IT). The resectoscope was then fully assembled. On the right lateral wall was a previously resected tumor. It was away from the  ureteral orifice. The necrotic tissue was resected first. I then resected down through the lamina propria and through the muscularis propria. Fat was seen at the base the tumor. The deep dissection of the tumor base was resected. It was labeled tumor base. It was sent separately. The remainder of the specimens were then evacuated from bladder. Hemostasis obtained and was excellent. Again, there was fat visible endoscopically proving complete muscle base resection. Hemostasis was excellent. The patient's bladder was drained. She was woke from anesthesia and transferred to position the postanesthesia care unit.  Plan: The patient will follow up in one week for pathology results.  Baruch Gouty, M.D.

## 2016-08-30 NOTE — H&P (Signed)
Kim Espinoza [birth year] [id number]  Referring provider: [name], MD [address] [facility] [place], [place] [address]      Chief Complaint  Patient presents with  . Follow-up    Pathology results    HPI: The patient presents today for follow-up after undergoing a transurethral resection of bladder tumor in the operating room. She had a 2 cm papular tumor on the right lateral wall with adjacent 1 cm satellite tumor near the bladder neck. These were found during hematuria workup that was otherwise negative. 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 It was indeterminate if there was muscularis propria in the specimen.  However, during the surgery stent was seen in the bed of the resection site.  Since surgery she has done well. She is voiding without complaints. She does not have hematuria.   PMH:     Past Medical History:  Diagnosis Date  . Abnormal mammogram 2011, 2012   Right side.   . Anxiety   . Breast screening, unspecified   . Hypercholesterolemia since 2001  . Hypertension    since approx. 2001  . Mammographic microcalcification   . Personal history of tobacco use, presenting hazards to health   . Special screening for malignant neoplasms, colon   . 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 BREAST BIOPSY Right ??   x2 - core w/clip - neg  . BREAST BIOPSY-right  2011   Stereotactic - Dr. Bary Castilla- Benign breast tissue containing stromal calcifications wiithin.  Periductal Fibrosis.  Marland Kitchen BREAST SURGERY Right 2011, 2013   stero biopsy  . CAROTID ENDARTERECTOMY  2004  . CATARACT EXTRACTION EXTRACAPSULAR  2013   Left eye  . COLONOSCOPY  2012   Dr. Bary Castilla  . EYE SURGERY Left    cataract   . TRANSURETHRAL RESECTION OF BLADDER TUMOR WITH MITOMYCIN-C N/A 07/11/2016   Procedure: TRANSURETHRAL RESECTION OF BLADDER  TUMOR WITH MITOMYCIN-C;  Surgeon: [name], MD;  Location: [facility] ORS;  Service: Urology;  Laterality: N/A;  . TUBAL LIGATION    . VASCULAR SURGERY Left 2004   carotid endarderectomy    Home Medications:        Medication List           Accurate as of 07/19/16  4:03 PM. Always use your most recent med list.           amLODipine 5 MG tablet Commonly known as:  NORVASC Take 5 mg by mouth once. Am of surgery  aspirin EC 81 MG tablet Take 81 mg by mouth every evening.  atenolol 50 MG tablet Commonly known as:  TENORMIN Take 50 mg by mouth 2 (two) times daily.  B-complex with vitamin C tablet Take 1 tablet by mouth every evening.  Flaxseed Oil 1000 MG Caps Take 1,000 mg by mouth every evening.  HYDROcodone-acetaminophen 5-325 MG tablet Commonly known as:  NORCO/VICODIN Take 1-2 tablets by mouth every 6 (six) hours as needed for moderate pain.  nortriptyline 25 MG capsule Commonly known as:  PAMELOR Take 25 mg by mouth at bedtime.  oxybutynin 5 MG tablet Commonly known as:  DITROPAN Take 1 tablet (5 mg total) by mouth every 8 (eight) hours as needed for bladder spasms.  simvastatin 10 MG tablet Commonly known as:  ZOCOR Take 10 mg by mouth at bedtime.  vitamin B-12 250 MCG tablet Commonly known  as:  CYANOCOBALAMIN Take 250 mcg by mouth every evening.  vitamin B-6 250 MG tablet Take 250 mg by mouth every evening.      Allergies: No Known Allergies  Family History:       Family History  Problem Relation Age of Onset  . Cancer Father     lung cancer - deceased  . Cancer Sister 83    breast cancer - alive and well  . Breast cancer Sister   . Breast cancer Paternal Aunt   . Colon cancer Neg Hx   . Ovarian cancer Neg Hx   . Hematuria Neg Hx   . Prostate cancer Neg Hx     Social History:  reports that she quit smoking about 10 years ago. She has never used smokeless tobacco. She reports that she does not drink alcohol or use  drugs.  ROS: UROLOGY Frequent Urination?: No Hard to postpone urination?: No Burning/pain with urination?: No Get up at night to urinate?: No Leakage of urine?: No Urine stream starts and stops?: No Trouble starting stream?: No Do you have to strain to urinate?: No Blood in urine?: No Urinary tract infection?: No Sexually transmitted disease?: No Injury to kidneys or bladder?: No Painful intercourse?: No Weak stream?: No Currently pregnant?: No Vaginal bleeding?: No Last menstrual period?: n  Gastrointestinal Nausea?: No Vomiting?: No Indigestion/heartburn?: No Diarrhea?: No Constipation?: No  Constitutional Fever: No Night sweats?: No Weight loss?: No Fatigue?: No  Skin Skin rash/lesions?: No Itching?: No  Eyes Blurred vision?: No Double vision?: No  Ears/Nose/Throat Sore throat?: No Sinus problems?: No  Hematologic/Lymphatic Swollen glands?: No Easy bruising?: No  Cardiovascular Leg swelling?: No Chest pain?: No  Respiratory Cough?: No Shortness of breath?: No  Endocrine Excessive thirst?: No  Musculoskeletal Back pain?: No Joint pain?: No  Neurological Headaches?: No Dizziness?: No  Psychologic Depression?: No Anxiety?: No  Physical Exam: There were no vitals taken for this visit.  Constitutional:  Alert and oriented, No acute distress. HEENT:  AT, moist mucus membranes.  Trachea midline, no masses. Cardiovascular: No clubbing, cyanosis, or edema. RRR. Respiratory: Normal respiratory effort, no increased work of breathing. Lungs clear. GI: Abdomen is soft, nontender, nondistended, no abdominal masses GU: No CVA tenderness.  Skin: No rashes, bruises or suspicious lesions. Lymph: No cervical or inguinal adenopathy. Neurologic: Grossly intact, no focal deficits, moving all 4 extremities. Psychiatric: Normal mood and affect.  Laboratory Data: RecentLabs       Lab Results  Component Value Date   WBC 11.2 (H)  06/14/2016   HGB 14.4 06/14/2016   HCT 41.7 06/14/2016   MCV 91.1 06/14/2016   PLT 208 06/14/2016      RecentLabs       Lab Results  Component Value Date   CREATININE 0.62 06/14/2016      RecentLabs  No results found for: PSA    RecentLabs  No results found for: TESTOSTERONE    RecentLabs  No results found for: HGBA1C    Urinalysis Labs(Brief)          Component Value Date/Time   COLORURINE RED (A) 06/14/2016 1535   APPEARANCEUR Clear 06/27/2016 1035   LABSPEC 1.001 (L) 06/14/2016 1535   PHURINE 7.0 06/14/2016 1535   GLUCOSEU Negative 06/27/2016 1035   HGBUR 3+ (A) 06/14/2016 1535   BILIRUBINUR Negative 06/27/2016 [place] 06/14/2016 1535   PROTEINUR Negative 06/27/2016 1035   PROTEINUR 100 (A) 06/14/2016 1535   NITRITE Negative 06/27/2016 1035  NITRITE NEGATIVE 06/14/2016 1535   LEUKOCYTESUR Negative 06/27/2016 1035        Assessment & Plan:  1. Bladder Cancer Repeat TURBT today  [name], MD  [facility] [address], [place], [place] [address] [phone]

## 2016-08-30 NOTE — Transfer of Care (Signed)
Immediate Anesthesia Transfer of Care Note  Patient: Kim Espinoza  Procedure(s) Performed: Procedure(s): TRANSURETHRAL RESECTION OF BLADDER TUMOR (TURBT) SMALL (N/A)  Patient Location: PACU  Anesthesia Type:General  Level of Consciousness: sedated  Airway & Oxygen Therapy: Patient Spontanous Breathing and Patient connected to face mask oxygen  Post-op Assessment: Report given to RN and Post -op Vital signs reviewed and stable  Post vital signs: Reviewed and stable  Last Vitals:  Vitals:   08/30/16 1211 08/30/16 1433  BP:  135/65  Pulse:  70  Resp:  17  Temp: 36.4 C 36.3 C    Last Pain:  Vitals:   08/30/16 1433  TempSrc:   PainSc: Asleep         Complications: No apparent anesthesia complications

## 2016-09-01 LAB — SURGICAL PATHOLOGY

## 2016-09-07 ENCOUNTER — Other Ambulatory Visit: Payer: Medicare Other

## 2016-09-08 ENCOUNTER — Encounter: Payer: Self-pay | Admitting: Urology

## 2016-09-08 ENCOUNTER — Ambulatory Visit (INDEPENDENT_AMBULATORY_CARE_PROVIDER_SITE_OTHER): Payer: Medicare Other | Admitting: Urology

## 2016-09-08 VITALS — BP 153/88 | HR 105 | Ht 64.0 in | Wt 143.1 lb

## 2016-09-08 DIAGNOSIS — C678 Malignant neoplasm of overlapping sites of bladder: Secondary | ICD-10-CM | POA: Diagnosis not present

## 2016-09-08 NOTE — Progress Notes (Signed)
09/08/2016 3:30 PM   [place] [birth year] [id number]  Referring provider: [name], MD [address] [facility] [place], [place] [address]  Chief Complaint  Patient presents with  . Follow-up    HPI: The patient presents today for follow-up after undergoing a transurethral resection of bladder tumor in the operating room. She had a 2 cm papular tumor on the right lateral wall with adjacent 1 cm satellite tumor near the bladder neck. These were found during hematuria workup that was otherwise negative. 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 It was indeterminate if there was muscularis propria in the specimen.  However, during the surgery stent was seen in the bed of the resection site.    She underwent repeat TURBT due to her stage. There was muscle in the specimen however there is no residual carcinoma.   PMH: Past Medical History:  Diagnosis Date  . Abnormal mammogram 2011, 2012   Right side.   . Anxiety   . Breast screening, unspecified   . Cancer ([place])    "bladder stage 1"  . Hypercholesterolemia since 2001  . Hypertension    since approx. 2001  . Mammographic microcalcification   . Personal history of tobacco use, presenting hazards to health   . Special screening for malignant neoplasms, colon   . 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 BREAST BIOPSY Right ??   x2 - core w/clip - neg  . BREAST BIOPSY-right  2011   Stereotactic - Dr. Bary Castilla- Benign breast tissue containing stromal calcifications wiithin.  Periductal Fibrosis.  Marland Kitchen BREAST SURGERY Right 2011, 2013   stero biopsy  . CAROTID ENDARTERECTOMY  2004  . CATARACT EXTRACTION EXTRACAPSULAR  2013   Left eye  . COLONOSCOPY  2012   Dr. Bary Castilla  . EYE SURGERY Left    cataract   . TRANSURETHRAL RESECTION OF BLADDER TUMOR N/A 08/30/2016   Procedure: TRANSURETHRAL RESECTION OF BLADDER  TUMOR (TURBT) SMALL;  Surgeon: [name], MD;  Location: [facility] ORS;  Service: Urology;  Laterality: N/A;  . TRANSURETHRAL RESECTION OF BLADDER TUMOR WITH MITOMYCIN-C N/A 07/11/2016   Procedure: TRANSURETHRAL RESECTION OF BLADDER TUMOR WITH MITOMYCIN-C;  Surgeon: [name], MD;  Location: [facility] ORS;  Service: Urology;  Laterality: N/A;  . TUBAL LIGATION    . VASCULAR SURGERY Left 2004   carotid endarderectomy    Home Medications:  Allergies as of 09/08/2016   No Known Allergies     Medication List       Accurate as of 09/08/16  3:30 PM. Always use your most recent med list.          ALPRAZolam 0.25 MG tablet Commonly known as:  XANAX Take 0.25 mg by mouth daily as needed for anxiety.   amLODipine 5 MG tablet Commonly known as:  NORVASC Take 5 mg by mouth once. Am of surgery   aspirin EC 81 MG tablet Take 81 mg by mouth every evening.   atenolol 50 MG tablet Commonly known as:  TENORMIN Take 50 mg by mouth 2 (two) times daily.   B-complex with vitamin C tablet Take 1 tablet by mouth every evening.   cephALEXin 500 MG capsule Commonly known as:  KEFLEX Take 1 capsule (500 mg total) by mouth 3 (three) times daily.   Flaxseed Oil 1000 MG Caps Take 1,000 mg by mouth every evening.   FOLIC ACID PO  Take 1 tablet by mouth daily.   HYDROcodone-acetaminophen 5-325 MG tablet Commonly known as:  NORCO/VICODIN Take 1-2 tablets by mouth every 4 (four) hours as needed for moderate pain.   nortriptyline 25 MG capsule Commonly known as:  PAMELOR Take 25 mg by mouth at bedtime.   oxybutynin 5 MG tablet Commonly known as:  DITROPAN Take 1 tablet (5 mg total) by mouth every 8 (eight) hours as needed for bladder spasms.   simvastatin 10 MG tablet Commonly known as:  ZOCOR Take 10 mg by mouth daily.   vitamin B-12 250 MCG tablet Commonly known as:  CYANOCOBALAMIN Take 250 mcg by mouth every evening.   vitamin B-6 250 MG tablet Take 250 mg by mouth every  evening.       Allergies: No Known Allergies  Family History: Family History  Problem Relation Age of Onset  . Cancer Father     lung cancer - deceased  . Cancer Sister 49    breast cancer - alive and well  . Breast cancer Sister   . Breast cancer Paternal Aunt   . Colon cancer Neg Hx   . Ovarian cancer Neg Hx   . Hematuria Neg Hx   . Prostate cancer Neg Hx     Social History:  reports that she quit smoking about 10 years ago. She has never used smokeless tobacco. She reports that she does not drink alcohol or use drugs.  ROS: UROLOGY Frequent Urination?: No Hard to postpone urination?: No Burning/pain with urination?: No Get up at night to urinate?: No Leakage of urine?: No Urine stream starts and stops?: No Trouble starting stream?: No Do you have to strain to urinate?: No Blood in urine?: No Urinary tract infection?: No Sexually transmitted disease?: No Injury to kidneys or bladder?: No Painful intercourse?: No Weak stream?: No Currently pregnant?: No Vaginal bleeding?: No Last menstrual period?: n  Gastrointestinal Nausea?: No Vomiting?: No Indigestion/heartburn?: No Diarrhea?: No Constipation?: No  Constitutional Fever: No Night sweats?: No Weight loss?: No Fatigue?: No  Skin Skin rash/lesions?: No Itching?: No  Eyes Blurred vision?: No Double vision?: No  Ears/Nose/Throat Sore throat?: No Sinus problems?: No  Hematologic/Lymphatic Swollen glands?: No Easy bruising?: No  Cardiovascular Leg swelling?: No Chest pain?: No  Respiratory Cough?: No Shortness of breath?: No  Endocrine Excessive thirst?: No  Musculoskeletal Back pain?: No Joint pain?: No  Neurological Headaches?: No Dizziness?: No  Psychologic Depression?: No Anxiety?: No  Physical Exam: BP (!) 153/88   Pulse (!) 105   Ht 5\' 4"  (1.626 m)   Wt 143 lb 1.6 oz (64.9 kg)   BMI 24.56 kg/m   Constitutional:  Alert and oriented, No acute distress. HEENT: [place]  AT, moist mucus membranes.  Trachea midline, no masses. Cardiovascular: No clubbing, cyanosis, or edema. Respiratory: Normal respiratory effort, no increased work of breathing. GI: Abdomen is soft, nontender, nondistended, no abdominal masses GU: No CVA tenderness.  Skin: No rashes, bruises or suspicious lesions. Lymph: No cervical or inguinal adenopathy. Neurologic: Grossly intact, no focal deficits, moving all 4 extremities. Psychiatric: Normal mood and affect.  Laboratory Data: Lab Results  Component Value Date   WBC 7.0 08/17/2016   HGB 13.3 08/17/2016   HCT 39.4 08/17/2016   MCV 92.0 08/17/2016   PLT 197 08/17/2016    Lab Results  Component Value Date   CREATININE 0.59 08/16/2016    No results found for: PSA  No results found for: TESTOSTERONE  No results found for: HGBA1C  Urinalysis    Component Value Date/Time   COLORURINE RED (A) 06/14/2016 1535   APPEARANCEUR Clear 06/27/2016 1035   LABSPEC 1.001 (L) 06/14/2016 1535   PHURINE 7.0 06/14/2016 1535   GLUCOSEU Negative 06/27/2016 1035   HGBUR 3+ (A) 06/14/2016 1535   BILIRUBINUR Negative 06/27/2016 1035   KETONESUR NEGATIVE 06/14/2016 1535   PROTEINUR Negative 06/27/2016 1035   PROTEINUR 100 (A) 06/14/2016 1535   NITRITE Negative 06/27/2016 1035   NITRITE NEGATIVE 06/14/2016 1535   LEUKOCYTESUR Negative 06/27/2016 1035    Assessment & Plan:    1. pT1 TCC of bladder I discussed with the patient and her repeat TURBT results. At this point, she is a candidate for induction BCG. We discussed the risks, benefits, indications of this procedure. She understands the goal of this is to reduce both progression and recurrence. She understands that this is 6 treatments that'll take place approximate 6 weeks from now. She understands this as instillation of medication or bladder. She understands the risks include but are not limited to, bleeding, infection, dysuria, and BCG sepsis. She understands that she will need to  bleach her toilet after voiding after each treatment. All questions were answered. She'll follow-up in 6 weeks to institute a 6 week cycle of BCG. She will see me 3 months after this for cystoscopy.  Return for BCG x6 in 6 weeks. cystoscopy 3 months after.  [name], MD  [facility] [address], [place], [place] [address] [phone]

## 2016-09-13 ENCOUNTER — Ambulatory Visit: Payer: Medicare Other

## 2016-09-13 ENCOUNTER — Ambulatory Visit: Admit: 2016-09-13 | Payer: Medicare Other | Admitting: Unknown Physician Specialty

## 2016-09-13 SURGERY — COLONOSCOPY WITH PROPOFOL
Anesthesia: General

## 2016-10-04 ENCOUNTER — Ambulatory Visit
Admission: RE | Admit: 2016-10-04 | Discharge: 2016-10-04 | Disposition: A | Discharge status: Home / Self Care | Payer: Medicare Other | Source: Ambulatory Visit | Attending: Family Medicine | Admitting: Family Medicine

## 2016-10-04 DIAGNOSIS — Z1231 Encounter for screening mammogram for malignant neoplasm of breast: Secondary | ICD-10-CM

## 2016-10-19 ENCOUNTER — Ambulatory Visit: Payer: Medicare Other | Admitting: Urology

## 2016-10-19 ENCOUNTER — Encounter: Payer: Self-pay | Admitting: Urology

## 2016-10-19 VITALS — BP 130/76 | HR 78 | Ht 64.0 in | Wt 143.1 lb

## 2016-10-19 DIAGNOSIS — C679 Malignant neoplasm of bladder, unspecified: Secondary | ICD-10-CM | POA: Diagnosis not present

## 2016-10-19 LAB — URINALYSIS, COMPLETE
Bilirubin, UA: NEGATIVE
Glucose, UA: NEGATIVE
Ketones, UA: NEGATIVE
NITRITE UA: NEGATIVE
PROTEIN UA: NEGATIVE
Specific Gravity, UA: 1.01 (ref 1.005–1.030)
Urobilinogen, Ur: 0.2 mg/dL (ref 0.2–1.0)
pH, UA: 5 (ref 5.0–7.5)

## 2016-10-19 LAB — MICROSCOPIC EXAMINATION: Epithelial Cells (non renal): >10 /hpf — AB (ref 0–10)

## 2016-10-19 MED ORDER — LIDOCAINE HCL 2 % EX GEL
1.0000 "application " | Freq: Once | CUTANEOUS | Status: AC
  Administered 2016-10-19: 1 via URETHRAL

## 2016-10-19 MED ORDER — BCG LIVE 50 MG IS SUSR
3.2400 mL | Freq: Once | INTRAVESICAL | Status: AC
  Administered 2016-10-19: 81 mg via INTRAVESICAL

## 2016-10-19 NOTE — Progress Notes (Signed)
BCG Bladder Instillation  BCG # 1  Due to Bladder Cancer patient is present today for a BCG treatment. Patient was cleaned and prepped in a sterile fashion with betadine and lidocaine 2% jelly was instilled into the urethra.  A 14FR catheter was inserted, urine return was noted 67ml, urine was yellow in color.  39ml of reconstituted BCG was instilled into the bladder. The catheter was then removed. Patient tolerated well, no complications were noted.  Preformed by: Zara Council PA-C and Lyndee Hensen CMA  Follow up/ Additional notes:One week

## 2016-10-19 NOTE — Progress Notes (Signed)
10/19/2016 4:19 PM   Bonnita Nasuti [name] 12/16/1940 [id number]  Referring provider: [name], MD [address] [facility] [place], [place] [address]  Chief Complaint  Patient presents with  . Bladder Cancer    BCG  1       HPI: 76 yo WF who presents today to begin an induction series of  6 weekly treatments of BCG.     Patient underwent a transurethral resection of bladder tumor in the operating room on 08/30/2016.  She had a 2 cm papular tumor on the right lateral wall with adjacent 1 cm satellite tumor near the bladder neck. These were found during hematuria workup that was otherwise negative. 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 It was indeterminate if there was muscularispropria in the specimen. However, during the surgery stent was seen in the bed of the resection site.    She underwent repeat TURBT due to her stage. There was muscle in the specimen however there is no residual carcinoma.  Today, she has read and signed the consent.  Her questions are answered and she is ready to begin.   She is not having any urinary complaints.  She specifically denies dysuria, gross hematuria and suprapubic pain.  She has not had fevers, chills, nausea and vomiting.   UA today demonstrates 11-30 WBC's and 3-10 RBC's.    PMH: Past Medical History:  Diagnosis Date  . Abnormal mammogram 2011, 2012   Right side.   . Anxiety   . Breast screening, unspecified   . Cancer ([facility])    "bladder stage 1"  . Hypercholesterolemia since 2001  . Hypertension    since approx. 2001  . Mammographic microcalcification   . Personal history of tobacco use, presenting hazards to health   . Special screening for malignant neoplasms, colon   . 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 BREAST BIOPSY Right ??   x2 - core w/clip - neg  . BREAST BIOPSY-right  2011   Stereotactic - Dr.  Bary Castilla- Benign breast tissue containing stromal calcifications wiithin.  Periductal Fibrosis.  Marland Kitchen BREAST SURGERY Right 2011, 2013   stero biopsy  . CAROTID ENDARTERECTOMY  2004  . CATARACT EXTRACTION EXTRACAPSULAR  2013   Left eye  . COLONOSCOPY  2012   Dr. Bary Castilla  . EYE SURGERY Left    cataract   . TRANSURETHRAL RESECTION OF BLADDER TUMOR N/A 08/30/2016   Procedure: TRANSURETHRAL RESECTION OF BLADDER TUMOR (TURBT) SMALL;  Surgeon: [name], MD;  Location: [facility] ORS;  Service: Urology;  Laterality: N/A;  . TRANSURETHRAL RESECTION OF BLADDER TUMOR WITH MITOMYCIN-C N/A 07/11/2016   Procedure: TRANSURETHRAL RESECTION OF BLADDER TUMOR WITH MITOMYCIN-C;  Surgeon: [name], MD;  Location: [facility] ORS;  Service: Urology;  Laterality: N/A;  . TUBAL LIGATION    . VASCULAR SURGERY Left 2004   carotid endarderectomy    Home Medications:  Allergies as of 10/19/2016   No Known Allergies     Medication List       Accurate as of 10/19/16  4:19 PM. Always use your most recent med list.          ALPRAZolam 0.25 MG tablet Commonly known as:  XANAX Take 0.25 mg by mouth daily as needed for anxiety.   amLODipine 5 MG tablet Commonly known as:  NORVASC Take by mouth.   amLODipine 5 MG tablet Commonly known  as:  NORVASC Take 5 mg by mouth once. Am of surgery   aspirin EC 81 MG tablet Take 81 mg by mouth every evening.   atenolol 50 MG tablet Commonly known as:  TENORMIN Take 50 mg by mouth 2 (two) times daily.   azithromycin 250 MG tablet Commonly known as:  ZITHROMAX Take 2 tablets (500mg ) by mouth on Day 1. Take 1 tablet (250mg ) by mouth on Days 2-5.   B-complex with vitamin C tablet Take 1 tablet by mouth every evening.   cephALEXin 500 MG capsule Commonly known as:  KEFLEX Take 1 capsule (500 mg total) by mouth 3 (three) times daily.   Flaxseed Oil 1000 MG Caps Take 1,000 mg by mouth every evening.   FOLIC ACID PO Take 1 tablet by mouth daily.     HYDROcodone-acetaminophen 5-325 MG tablet Commonly known as:  NORCO/VICODIN Take 1-2 tablets by mouth every 4 (four) hours as needed for moderate pain.   nortriptyline 25 MG capsule Commonly known as:  PAMELOR Take 25 mg by mouth at bedtime.   omeprazole 10 MG capsule Commonly known as:  PRILOSEC Take by mouth.   oxybutynin 5 MG tablet Commonly known as:  DITROPAN Take 1 tablet (5 mg total) by mouth every 8 (eight) hours as needed for bladder spasms.   simvastatin 10 MG tablet Commonly known as:  ZOCOR Take 10 mg by mouth daily.   vitamin B-12 250 MCG tablet Commonly known as:  CYANOCOBALAMIN Take 250 mcg by mouth every evening.   vitamin B-6 250 MG tablet Take 250 mg by mouth every evening.       Allergies: No Known Allergies  Family History: Family History  Problem Relation Age of Onset  . Cancer Father     lung cancer - deceased  . Cancer Sister 71    breast cancer - alive and well  . Breast cancer Sister 53  . Breast cancer Paternal Aunt   . Colon cancer Neg Hx   . Ovarian cancer Neg Hx   . Hematuria Neg Hx   . Prostate cancer Neg Hx   . Bladder Cancer Neg Hx     Social History:  reports that she quit smoking about 10 years ago. She has never used smokeless tobacco. She reports that she does not drink alcohol or use drugs.  ROS: UROLOGY Frequent Urination?: No Hard to postpone urination?: No Burning/pain with urination?: No Get up at night to urinate?: No Leakage of urine?: No Urine stream starts and stops?: No Trouble starting stream?: No Do you have to strain to urinate?: No Blood in urine?: No Urinary tract infection?: No Sexually transmitted disease?: No Injury to kidneys or bladder?: No Painful intercourse?: No Weak stream?: No Currently pregnant?: No Vaginal bleeding?: No Last menstrual period?: n  Gastrointestinal Nausea?: No Vomiting?: No Indigestion/heartburn?: No Diarrhea?: No Constipation?: No  Constitutional Fever:  No Night sweats?: No Weight loss?: No Fatigue?: No  Skin Skin rash/lesions?: No Itching?: No  Eyes Blurred vision?: No Double vision?: No  Ears/Nose/Throat Sore throat?: No Sinus problems?: No  Hematologic/Lymphatic Swollen glands?: No Easy bruising?: No  Cardiovascular Leg swelling?: No Chest pain?: No  Respiratory Cough?: No Shortness of breath?: No  Endocrine Excessive thirst?: No  Musculoskeletal Back pain?: No Joint pain?: No  Neurological Headaches?: No Dizziness?: No  Psychologic Depression?: No Anxiety?: No  Physical Exam: BP 130/76   Pulse 78   Ht 5\' 4"  (1.626 m)   Wt 143 lb 1.6 oz (64.9 kg)  BMI 24.56 kg/m   Constitutional: Well nourished. Alert and oriented, No acute distress. HEENT: [place] AT, moist mucus membranes. Trachea midline, no masses. Cardiovascular: No clubbing, cyanosis, or edema. Respiratory: Normal respiratory effort, no increased work of breathing. GI: Abdomen is soft, non tender, non distended, no abdominal masses. Liver and spleen not palpable.  No hernias appreciated.  Stool sample for occult testing is not indicated.   GU: No CVA tenderness.  No bladder fullness or masses.   Skin: No rashes, bruises or suspicious lesions. Lymph: No cervical or inguinal adenopathy. Neurologic: Grossly intact, no focal deficits, moving all 4 extremities. Psychiatric: Normal mood and affect.  Laboratory Data: Lab Results  Component Value Date   WBC 7.0 08/17/2016   HGB 13.3 08/17/2016   HCT 39.4 08/17/2016   MCV 92.0 08/17/2016   PLT 197 08/17/2016    Lab Results  Component Value Date   CREATININE 0.59 08/16/2016    Lab Results  Component Value Date   AST 31 06/14/2016   Lab Results  Component Value Date   ALT 28 06/14/2016    Urinalysis 11-30 WBC's.  3-10 RBC's.  See EPIC.   Assessment & Plan:    1. Malignant neoplasm of urinary bladder, unspecified site (HCC)  - pT1 TCC of bladder  - Reviewed BCG treatment  course, possible side effects including BCG sepsis, bladder irritation, worsening of her urinary symptoms  - # 1 of 6 BCG installed today  - Patient was instructed to pour bleach down her toilet for the next 6 hours  -  Instructed to call the office if she should experience fevers greater than 102, chills/rigors, onset of a new cough, night sweats or further bladder spasms or inability to urinate   - RTC in one week for # 2 BCG  - Surveillance protocol also discussed today including cystoscopy every 3 months for at least 2 years and then spread out thereafter  - Urinalysis, Complete  - bcg vaccine injection 81 mg; Instill 3.24 mLs (81 mg total) into the bladder once.  - lidocaine (XYLOCAINE) 2 % jelly 1 application; Place 1 application into the urethra once.  - Urinalysis, Complete   Return in about 1 week (around 10/26/2016) for # 2 BCG.  These notes generated with voice recognition software. I apologize for typographical errors.  Zara Council, [facility] [address], [place], [place] [address] [phone]

## 2016-10-26 ENCOUNTER — Encounter: Payer: Self-pay | Admitting: Urology

## 2016-10-26 ENCOUNTER — Ambulatory Visit: Payer: Medicare Other | Admitting: Urology

## 2016-10-26 VITALS — BP 197/76 | HR 120 | Ht 64.0 in | Wt 142.0 lb

## 2016-10-26 DIAGNOSIS — C679 Malignant neoplasm of bladder, unspecified: Secondary | ICD-10-CM | POA: Diagnosis not present

## 2016-10-26 LAB — URINALYSIS, COMPLETE
Bilirubin, UA: NEGATIVE
GLUCOSE, UA: NEGATIVE
KETONES UA: NEGATIVE
Nitrite, UA: NEGATIVE
SPEC GRAV UA: 1.02 (ref 1.005–1.030)
UUROB: 0.2 mg/dL (ref 0.2–1.0)
pH, UA: 5.5 (ref 5.0–7.5)

## 2016-10-26 LAB — MICROSCOPIC EXAMINATION

## 2016-10-26 MED ORDER — BCG LIVE 50 MG IS SUSR
3.2400 mL | Freq: Once | INTRAVESICAL | Status: AC
  Administered 2016-10-26: 81 mg via INTRAVESICAL

## 2016-10-26 MED ORDER — LIDOCAINE HCL 2 % EX GEL
1.0000 "application " | Freq: Once | CUTANEOUS | Status: AC
  Administered 2016-10-26: 1 via URETHRAL

## 2016-10-26 NOTE — Progress Notes (Signed)
10/26/2016 3:58 PM   Kim Espinoza [birth year] [id number]  Referring provider: [name], MD [address] [facility] [place], [place] [address]  Chief Complaint  Patient presents with  . Bladder Cancer    BCG 2     HPI: 76 yo WF who presents today to have her second of 6 weekly treatments of BCG.     Patient underwent a transurethral resection of bladder tumor in the operating room on 08/30/2016.  She had a 2 cm papular tumor on the right lateral wall with adjacent 1 cm satellite tumor near the bladder neck. These were found during hematuria workup that was otherwise negative. 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 It was indeterminate if there was muscularispropria in the specimen. However, during the surgery stent was seen in the bed of the resection site.    She underwent repeat TURBT due to her stage. There was muscle in the specimen however there is no residual carcinoma.  Today, she has read and signed the consent.  Her questions are answered and she is ready to begin.   She is not having any urinary complaints.  She specifically denies dysuria, gross hematuria and suprapubic pain.  She has not had fevers, chills, nausea and vomiting.   UA today demonstrates > 30 WBC's and 3-10 RBC's.    PMH: Past Medical History:  Diagnosis Date  . Abnormal mammogram 2011, 2012   Right side.   . Anxiety   . Breast screening, unspecified   . Cancer ([facility])    "bladder stage 1"  . Hypercholesterolemia since 2001  . Hypertension    since approx. 2001  . Mammographic microcalcification   . Personal history of tobacco use, presenting hazards to health   . Special screening for malignant neoplasms, colon   . 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 BREAST BIOPSY Right ??   x2 - core w/clip - neg  . BREAST BIOPSY-right  2011   Stereotactic - Dr. Bary Castilla- Benign  breast tissue containing stromal calcifications wiithin.  Periductal Fibrosis.  Marland Kitchen BREAST SURGERY Right 2011, 2013   stero biopsy  . CAROTID ENDARTERECTOMY  2004  . CATARACT EXTRACTION EXTRACAPSULAR  2013   Left eye  . COLONOSCOPY  2012   Dr. Bary Castilla  . EYE SURGERY Left    cataract   . TRANSURETHRAL RESECTION OF BLADDER TUMOR N/A 08/30/2016   Procedure: TRANSURETHRAL RESECTION OF BLADDER TUMOR (TURBT) SMALL;  Surgeon: [name], MD;  Location: [facility] ORS;  Service: Urology;  Laterality: N/A;  . TRANSURETHRAL RESECTION OF BLADDER TUMOR WITH MITOMYCIN-C N/A 07/11/2016   Procedure: TRANSURETHRAL RESECTION OF BLADDER TUMOR WITH MITOMYCIN-C;  Surgeon: [name], MD;  Location: [facility] ORS;  Service: Urology;  Laterality: N/A;  . TUBAL LIGATION    . VASCULAR SURGERY Left 2004   carotid endarderectomy    Home Medications:  Allergies as of 10/26/2016   No Known Allergies     Medication List       Accurate as of 10/26/16  3:58 PM. Always use your most recent med list.          ALPRAZolam 0.25 MG tablet Commonly known as:  XANAX Take 0.25 mg by mouth daily as needed for anxiety.   amLODipine 5 MG tablet Commonly known as:  NORVASC Take by mouth.   amLODipine 5 MG tablet Commonly known as:  NORVASC Take  5 mg by mouth once. Am of surgery   aspirin EC 81 MG tablet Take 81 mg by mouth every evening.   atenolol 50 MG tablet Commonly known as:  TENORMIN Take 50 mg by mouth 2 (two) times daily.   B-complex with vitamin C tablet Take 1 tablet by mouth every evening.   cephALEXin 500 MG capsule Commonly known as:  KEFLEX Take 1 capsule (500 mg total) by mouth 3 (three) times daily.   Flaxseed Oil 1000 MG Caps Take 1,000 mg by mouth every evening.   FOLIC ACID PO Take 1 tablet by mouth daily.   HYDROcodone-acetaminophen 5-325 MG tablet Commonly known as:  NORCO/VICODIN Take 1-2 tablets by mouth every 4 (four) hours as needed for moderate pain.   nortriptyline 25 MG  capsule Commonly known as:  PAMELOR Take 25 mg by mouth at bedtime.   omeprazole 10 MG capsule Commonly known as:  PRILOSEC Take by mouth.   oxybutynin 5 MG tablet Commonly known as:  DITROPAN Take 1 tablet (5 mg total) by mouth every 8 (eight) hours as needed for bladder spasms.   simvastatin 10 MG tablet Commonly known as:  ZOCOR Take 10 mg by mouth daily.   vitamin B-12 250 MCG tablet Commonly known as:  CYANOCOBALAMIN Take 250 mcg by mouth every evening.   vitamin B-6 250 MG tablet Take 250 mg by mouth every evening.       Allergies: No Known Allergies  Family History: Family History  Problem Relation Age of Onset  . Cancer Father     lung cancer - deceased  . Cancer Sister 54    breast cancer - alive and well  . Breast cancer Sister 31  . Breast cancer Paternal Aunt   . Colon cancer Neg Hx   . Ovarian cancer Neg Hx   . Hematuria Neg Hx   . Prostate cancer Neg Hx   . Bladder Cancer Neg Hx     Social History:  reports that she quit smoking about 10 years ago. She has never used smokeless tobacco. She reports that she does not drink alcohol or use drugs.  ROS: UROLOGY Frequent Urination?: No Hard to postpone urination?: No Burning/pain with urination?: No Get up at night to urinate?: No Leakage of urine?: No Urine stream starts and stops?: No Trouble starting stream?: No Do you have to strain to urinate?: No Blood in urine?: No Urinary tract infection?: No Sexually transmitted disease?: No Injury to kidneys or bladder?: No Painful intercourse?: No Weak stream?: No Currently pregnant?: No Vaginal bleeding?: No Last menstrual period?: n  Gastrointestinal Nausea?: No Vomiting?: No Indigestion/heartburn?: No Diarrhea?: No Constipation?: No  Constitutional Fever: No Night sweats?: No Weight loss?: No Fatigue?: No  Skin Skin rash/lesions?: No Itching?: No  Eyes Blurred vision?: No Double vision?: No  Ears/Nose/Throat Sore throat?:  No Sinus problems?: No  Hematologic/Lymphatic Swollen glands?: No Easy bruising?: No  Cardiovascular Leg swelling?: No Chest pain?: No  Respiratory Cough?: No Shortness of breath?: No  Endocrine Excessive thirst?: No  Musculoskeletal Back pain?: No Joint pain?: No  Neurological Headaches?: No Dizziness?: No  Psychologic Depression?: No Anxiety?: No  Physical Exam: BP (!) 197/76   Pulse (!) 120   Ht 5\' 4"  (1.626 m)   Wt 142 lb (64.4 kg)   BMI 24.37 kg/m   Constitutional: Well nourished. Alert and oriented, No acute distress. HEENT: [place] AT, moist mucus membranes. Trachea midline, no masses. Cardiovascular: No clubbing, cyanosis, or edema. Respiratory: Normal  respiratory effort, no increased work of breathing. GI: Abdomen is soft, non tender, non distended, no abdominal masses. Liver and spleen not palpable.  No hernias appreciated.  Stool sample for occult testing is not indicated.   GU: No CVA tenderness.  No bladder fullness or masses.   Skin: No rashes, bruises or suspicious lesions. Lymph: No cervical or inguinal adenopathy. Neurologic: Grossly intact, no focal deficits, moving all 4 extremities. Psychiatric: Normal mood and affect.  Laboratory Data: Lab Results  Component Value Date   WBC 7.0 08/17/2016   HGB 13.3 08/17/2016   HCT 39.4 08/17/2016   MCV 92.0 08/17/2016   PLT 197 08/17/2016    Lab Results  Component Value Date   CREATININE 0.59 08/16/2016    Lab Results  Component Value Date   AST 31 06/14/2016   Lab Results  Component Value Date   ALT 28 06/14/2016    Urinalysis > 30 WBC's.  3-10 RBC's.  See EPIC.   Assessment & Plan:    1. Malignant neoplasm of urinary bladder, unspecified site (HCC)  - pT1 TCC of bladder  - Reviewed BCG treatment course, possible side effects including BCG sepsis, bladder irritation, worsening of her urinary symptoms  - # 2 of 6 BCG installed today  - Patient was instructed to pour bleach down her  toilet for the next 6 hours  -  Instructed to call the office if she should experience fevers greater than 102, chills/rigors, onset of a new cough, night sweats or further bladder spasms or inability to urinate   - RTC in one week for # 3 BCG  - Surveillance protocol also discussed today including cystoscopy every 3 months for at least 2 years and then spread out thereafter  - Urinalysis, Complete  - bcg vaccine injection 81 mg; Instill 3.24 mLs (81 mg total) into the bladder once.  - lidocaine (XYLOCAINE) 2 % jelly 1 application; Place 1 application into the urethra once.  - Urinalysis, Complete   Return in about 1 week (around 11/02/2016) for # 3 BCG.  These notes generated with voice recognition software. I apologize for typographical errors.  Zara Council, [facility] [address] [place],  [address] [phone]

## 2016-10-26 NOTE — Progress Notes (Addendum)
BCG Bladder Instillation  BCG # 2  Due to Bladder Cancer patient is present today for a BCG treatment. Patient was cleaned and prepped in a sterile fashion with betadine and lidocaine 2% jelly was instilled into the urethra.  A 14FR catheter was inserted, urine return was noted 72ml, urine was yellow in color.  32ml of reconstituted BCG was instilled into the bladder. The catheter was then removed. Patient tolerated well, no complications were noted.  Preformed by: Zara Council PA-C and Lyndee Hensen CMA  Follow up/ Additional notes: One Week

## 2016-11-08 NOTE — Progress Notes (Signed)
11/09/2016 3:46 PM   Kim Espinoza Kim Espinoza 09-Jul-1941 [id number]  Referring provider: [name], MD [address] [facility] [place], [place] [address]  Chief Complaint  Patient presents with  . Bladder Cancer    BCG  #3    HPI: 76 yo WF who presents today to have her 3rd of 6 weekly treatments of BCG.     Patient underwent a transurethral resection of bladder tumor in the operating room on 08/30/2016.  She had a 2 cm papular tumor on the right lateral wall with adjacent 1 cm satellite tumor near the bladder neck. These were found during hematuria workup that was otherwise negative. 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 It was indeterminate if there was muscularispropria in the specimen. However, during the surgery stent was seen in the bed of the resection site.    She underwent repeat TURBT due to her stage. There was muscle in the specimen however there is no residual carcinoma.  Today, she is not having urinary complaints.  She specifically denies dysuria, gross hematuria and suprapubic pain.  She has not had fevers, chills, nausea and vomiting.  She does not have a new cough.  She was able to hold the instillation for the full two hours.  UA today demonstrates > 30 WBC's.    PMH: Past Medical History:  Diagnosis Date  . Abnormal mammogram 2011, 2012   Right side.   . Anxiety   . Breast screening, unspecified   . Cancer ([place])    "bladder stage 1"  . Hypercholesterolemia since 2001  . Hypertension    since approx. 2001  . Mammographic microcalcification   . Personal history of tobacco use, presenting hazards to health   . Special screening for malignant neoplasms, colon   . 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 BREAST BIOPSY Right ??   x2 - core w/clip - neg  . BREAST BIOPSY-right  2011   Stereotactic - Dr. Bary Castilla- Benign breast tissue containing  stromal calcifications wiithin.  Periductal Fibrosis.  Marland Kitchen BREAST SURGERY Right 2011, 2013   stero biopsy  . CAROTID ENDARTERECTOMY  2004  . CATARACT EXTRACTION EXTRACAPSULAR  2013   Left eye  . COLONOSCOPY  2012   Dr. Bary Castilla  . EYE SURGERY Left    cataract   . TRANSURETHRAL RESECTION OF BLADDER TUMOR N/A 08/30/2016   Procedure: TRANSURETHRAL RESECTION OF BLADDER TUMOR (TURBT) SMALL;  Surgeon: [name], MD;  Location: [facility] ORS;  Service: Urology;  Laterality: N/A;  . TRANSURETHRAL RESECTION OF BLADDER TUMOR WITH MITOMYCIN-C N/A 07/11/2016   Procedure: TRANSURETHRAL RESECTION OF BLADDER TUMOR WITH MITOMYCIN-C;  Surgeon: [name], MD;  Location: [facility] ORS;  Service: Urology;  Laterality: N/A;  . TUBAL LIGATION    . VASCULAR SURGERY Left 2004   carotid endarderectomy    Home Medications:  Allergies as of 11/09/2016   No Known Allergies     Medication List       Accurate as of 11/09/16  3:46 PM. Always use your most recent med list.          ALPRAZolam 0.25 MG tablet Commonly known as:  XANAX Take 0.25 mg by mouth daily as needed for anxiety.   amLODipine 5 MG tablet Commonly known as:  NORVASC Take by mouth.   amLODipine 5 MG tablet Commonly known as:  NORVASC Take 5 mg by mouth  once. Am of surgery   aspirin EC 81 MG tablet Take 81 mg by mouth every evening.   atenolol 50 MG tablet Commonly known as:  TENORMIN Take 50 mg by mouth 2 (two) times daily.   B-complex with vitamin C tablet Take 1 tablet by mouth every evening.   cephALEXin 500 MG capsule Commonly known as:  KEFLEX Take 1 capsule (500 mg total) by mouth 3 (three) times daily.   Flaxseed Oil 1000 MG Caps Take 1,000 mg by mouth every evening.   FOLIC ACID PO Take 1 tablet by mouth daily.   HYDROcodone-acetaminophen 5-325 MG tablet Commonly known as:  NORCO/VICODIN Take 1-2 tablets by mouth every 4 (four) hours as needed for moderate pain.   nortriptyline 25 MG capsule Commonly known  as:  PAMELOR Take 25 mg by mouth at bedtime.   omeprazole 10 MG capsule Commonly known as:  PRILOSEC Take by mouth.   oxybutynin 5 MG tablet Commonly known as:  DITROPAN Take 1 tablet (5 mg total) by mouth every 8 (eight) hours as needed for bladder spasms.   simvastatin 10 MG tablet Commonly known as:  ZOCOR Take 10 mg by mouth daily.   vitamin B-12 250 MCG tablet Commonly known as:  CYANOCOBALAMIN Take 250 mcg by mouth every evening.   vitamin B-6 250 MG tablet Take 250 mg by mouth every evening.       Allergies: No Known Allergies  Family History: Family History  Problem Relation Age of Onset  . Cancer Father     lung cancer - deceased  . Cancer Sister 82    breast cancer - alive and well  . Breast cancer Sister 89  . Breast cancer Paternal Aunt   . Colon cancer Neg Hx   . Ovarian cancer Neg Hx   . Hematuria Neg Hx   . Prostate cancer Neg Hx   . Bladder Cancer Neg Hx     Social History:  reports that she quit smoking about 10 years ago. She has never used smokeless tobacco. She reports that she does not drink alcohol or use drugs.  ROS: UROLOGY Frequent Urination?: No Hard to postpone urination?: No Burning/pain with urination?: No Get up at night to urinate?: No Leakage of urine?: No Urine stream starts and stops?: No Trouble starting stream?: No Do you have to strain to urinate?: No Blood in urine?: No Urinary tract infection?: No Sexually transmitted disease?: No Injury to kidneys or bladder?: No Painful intercourse?: No Weak stream?: No Currently pregnant?: No Vaginal bleeding?: No Last menstrual period?: n  Gastrointestinal Nausea?: No Vomiting?: No Indigestion/heartburn?: No Diarrhea?: No Constipation?: No  Constitutional Fever: No Night sweats?: No Weight loss?: No Fatigue?: No  Skin Skin rash/lesions?: No Itching?: No  Eyes Blurred vision?: No Double vision?: No  Ears/Nose/Throat Sore throat?: No Sinus problems?:  No  Hematologic/Lymphatic Swollen glands?: No Easy bruising?: No  Cardiovascular Leg swelling?: No Chest pain?: No  Respiratory Cough?: No Shortness of breath?: No  Endocrine Excessive thirst?: No  Musculoskeletal Back pain?: No Joint pain?: Yes  Neurological Headaches?: No Dizziness?: No  Psychologic Depression?: No Anxiety?: No  Physical Exam: Ht 5\' 4"  (1.626 m)   Wt 144 lb 9.6 oz (65.6 kg)   BMI 24.82 kg/m   Constitutional: Well nourished. Alert and oriented, No acute distress. HEENT: [place] AT, moist mucus membranes. Trachea midline, no masses. Cardiovascular: No clubbing, cyanosis, or edema. Respiratory: Normal respiratory effort, no increased work of breathing. GI: Abdomen is soft, non  tender, non distended, no abdominal masses. Liver and spleen not palpable.  No hernias appreciated.  Stool sample for occult testing is not indicated.   GU: No CVA tenderness.  No bladder fullness or masses.   Skin: No rashes, bruises or suspicious lesions. Lymph: No cervical or inguinal adenopathy. Neurologic: Grossly intact, no focal deficits, moving all 4 extremities. Psychiatric: Normal mood and affect.  Laboratory Data: Lab Results  Component Value Date   WBC 7.0 08/17/2016   HGB 13.3 08/17/2016   HCT 39.4 08/17/2016   MCV 92.0 08/17/2016   PLT 197 08/17/2016    Lab Results  Component Value Date   CREATININE 0.59 08/16/2016    Lab Results  Component Value Date   AST 31 06/14/2016   Lab Results  Component Value Date   ALT 28 06/14/2016    Urinalysis > 30 WBC's.  See EPIC.   Assessment & Plan:    1. Malignant neoplasm of urinary bladder, unspecified site (HCC)  - pT1 TCC of bladder  - Reviewed BCG treatment course, possible side effects including BCG sepsis, bladder irritation, worsening of her urinary symptoms  - # 3 of 6 BCG installed today  - Patient was instructed to pour bleach down her toilet for the next 6 hours  -  Instructed to call the  office if she should experience fevers greater than 102, chills/rigors, onset of a new cough, night sweats or further bladder spasms or inability to urinate   - RTC in one week for # 4 BCG  - Surveillance protocol also discussed today including cystoscopy every 3 months for at least 2 years and then spread out thereafter  - Urinalysis, Complete  - bcg vaccine injection 81 mg; Instill 3.24 mLs (81 mg total) into the bladder once.  - lidocaine (XYLOCAINE) 2 % jelly 1 application; Place 1 application into the urethra once.  - Urinalysis, Complete   Return in about 1 week (around 11/16/2016) for # 4 BCG.  These notes generated with voice recognition software. I apologize for typographical errors.  Zara Council, [facility] [address], [place], [place] [address] [phone]

## 2016-11-09 ENCOUNTER — Ambulatory Visit (INDEPENDENT_AMBULATORY_CARE_PROVIDER_SITE_OTHER): Payer: Medicare Other | Admitting: Urology

## 2016-11-09 ENCOUNTER — Encounter: Payer: Self-pay | Admitting: Urology

## 2016-11-09 VITALS — Ht 64.0 in | Wt 144.6 lb

## 2016-11-09 DIAGNOSIS — C679 Malignant neoplasm of bladder, unspecified: Secondary | ICD-10-CM

## 2016-11-09 LAB — URINALYSIS, COMPLETE
Bilirubin, UA: NEGATIVE
GLUCOSE, UA: NEGATIVE
Ketones, UA: NEGATIVE
NITRITE UA: NEGATIVE
Protein, UA: NEGATIVE
Specific Gravity, UA: 1.01 (ref 1.005–1.030)
UUROB: 0.2 mg/dL (ref 0.2–1.0)
pH, UA: 5 (ref 5.0–7.5)

## 2016-11-09 LAB — MICROSCOPIC EXAMINATION
RBC MICROSCOPIC, UA: NONE SEEN /HPF (ref 0–?)
WBC, UA: >30 /hpf — ABNORMAL HIGH (ref 0–?)

## 2016-11-09 MED ORDER — BCG LIVE 50 MG IS SUSR
3.2400 mL | Freq: Once | INTRAVESICAL | Status: AC
  Administered 2016-11-09: 81 mg via INTRAVESICAL

## 2016-11-09 MED ORDER — LIDOCAINE HCL 2 % EX GEL
1.0000 "application " | Freq: Once | CUTANEOUS | Status: AC
  Administered 2016-11-09: 1 via URETHRAL

## 2016-11-09 NOTE — Progress Notes (Signed)
BCG Bladder Instillation  BCG # 3  Due to Bladder Cancer patient is present today for a BCG treatment. Patient was cleaned and prepped in a sterile fashion with betadine and lidocaine 2% jelly was instilled into the urethra.  A 14FR catheter was inserted, urine return was noted 129ml, urine was yellow in color.  56ml of reconstituted BCG was instilled into the bladder. The catheter was then removed. Patient tolerated well, no complications were noted.  Preformed by: Zara Council PA-C and Lyndee Hensen CMA  Follow up/ Additional notes: One Week

## 2016-11-16 ENCOUNTER — Encounter: Payer: Self-pay | Admitting: Urology

## 2016-11-16 ENCOUNTER — Ambulatory Visit: Payer: Medicare Other | Admitting: Urology

## 2016-11-16 VITALS — BP 150/69 | HR 106 | Ht 64.0 in | Wt 146.9 lb

## 2016-11-16 DIAGNOSIS — N3001 Acute cystitis with hematuria: Secondary | ICD-10-CM

## 2016-11-16 DIAGNOSIS — C679 Malignant neoplasm of bladder, unspecified: Secondary | ICD-10-CM

## 2016-11-16 LAB — URINALYSIS, COMPLETE
BILIRUBIN UA: NEGATIVE
Glucose, UA: NEGATIVE
Nitrite, UA: POSITIVE — AB
PH UA: 5.5 (ref 5.0–7.5)
Specific Gravity, UA: >1.03 — ABNORMAL HIGH (ref 1.005–1.030)
Urobilinogen, Ur: 1 mg/dL (ref 0.2–1.0)

## 2016-11-16 LAB — MICROSCOPIC EXAMINATION: RBC, UA: >30 /hpf — ABNORMAL HIGH (ref 0–?)

## 2016-11-16 MED ORDER — LIDOCAINE HCL 2 % EX GEL: 1.0000 "application " | Freq: Once | CUTANEOUS | Status: DC

## 2016-11-16 MED ORDER — BCG LIVE 50 MG IS SUSR: 3.2400 mL | Freq: Once | INTRAVESICAL | Status: DC

## 2016-11-16 NOTE — Progress Notes (Signed)
Patient did not receive BCG treatment today.

## 2016-11-16 NOTE — Progress Notes (Signed)
11/16/2016 3:22 PM   Kim Espinoza November 08, 1940 [id number]  Referring provider: [name], MD [address] [facility] [place], [place] [address]  Chief Complaint  Patient presents with  . Bladder Cancer    BCG 4     HPI: 76 yo WF who presents today to have her 4 of 6 weekly treatments of BCG.     Patient underwent a transurethral resection of bladder tumor in the operating room on 08/30/2016.  She had a 2 cm papular tumor on the right lateral wall with adjacent 1 cm satellite tumor near the bladder neck. These were found during hematuria workup that was otherwise negative. 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 It was indeterminate if there was muscularispropria in the specimen. However, during the surgery stent was seen in the bed of the resection site.    She underwent repeat TURBT due to her stage. There was muscle in the specimen however there is no residual carcinoma.  Today, she is not having urinary complaints.  She specifically denies dysuria, gross hematuria and suprapubic pain.  She has not had fevers, chills, nausea and vomiting.  She does not have a new cough.  She was able to hold the instillation for the full two hours.  UA today demonstrates > 30 WBC's, > 30 RBC's, many bacteria and nitrite positive.    PMH: Past Medical History:  Diagnosis Date  . Abnormal mammogram 2011, 2012   Right side.   . Anxiety   . Breast screening, unspecified   . Cancer ([place])    "bladder stage 1"  . Hypercholesterolemia since 2001  . Hypertension    since approx. 2001  . Mammographic microcalcification   . Personal history of tobacco use, presenting hazards to health   . Special screening for malignant neoplasms, colon   . 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 BREAST BIOPSY Right ??   x2 - core w/clip - neg  . BREAST BIOPSY-right  2011   Stereotactic -  Dr. Bary Castilla- Benign breast tissue containing stromal calcifications wiithin.  Periductal Fibrosis.  Marland Kitchen BREAST SURGERY Right 2011, 2013   stero biopsy  . CAROTID ENDARTERECTOMY  2004  . CATARACT EXTRACTION EXTRACAPSULAR  2013   Left eye  . COLONOSCOPY  2012   Dr. Bary Castilla  . EYE SURGERY Left    cataract   . TRANSURETHRAL RESECTION OF BLADDER TUMOR N/A 08/30/2016   Procedure: TRANSURETHRAL RESECTION OF BLADDER TUMOR (TURBT) SMALL;  Surgeon: [name], MD;  Location: [facility] ORS;  Service: Urology;  Laterality: N/A;  . TRANSURETHRAL RESECTION OF BLADDER TUMOR WITH MITOMYCIN-C N/A 07/11/2016   Procedure: TRANSURETHRAL RESECTION OF BLADDER TUMOR WITH MITOMYCIN-C;  Surgeon: [name], MD;  Location: [facility] ORS;  Service: Urology;  Laterality: N/A;  . TUBAL LIGATION    . VASCULAR SURGERY Left 2004   carotid endarderectomy    Home Medications:  Allergies as of 11/16/2016   No Known Allergies     Medication List       Accurate as of 11/16/16  3:22 PM. Always use your most recent med list.          ALPRAZolam 0.25 MG tablet Commonly known as:  XANAX Take 0.25 mg by mouth daily as needed for anxiety.   amLODipine 5 MG tablet Commonly known as:  NORVASC Take by mouth.   amLODipine 5 MG tablet Commonly known  as:  NORVASC Take 5 mg by mouth once. Am of surgery   aspirin EC 81 MG tablet Take 81 mg by mouth every evening.   atenolol 50 MG tablet Commonly known as:  TENORMIN Take 50 mg by mouth 2 (two) times daily.   B-complex with vitamin C tablet Take 1 tablet by mouth every evening.   cephALEXin 500 MG capsule Commonly known as:  KEFLEX Take 1 capsule (500 mg total) by mouth 3 (three) times daily.   Flaxseed Oil 1000 MG Caps Take 1,000 mg by mouth every evening.   FOLIC ACID PO Take 1 tablet by mouth daily.   HYDROcodone-acetaminophen 5-325 MG tablet Commonly known as:  NORCO/VICODIN Take 1-2 tablets by mouth every 4 (four) hours as needed for moderate pain.     nortriptyline 25 MG capsule Commonly known as:  PAMELOR Take 25 mg by mouth at bedtime.   omeprazole 10 MG capsule Commonly known as:  PRILOSEC Take by mouth.   oxybutynin 5 MG tablet Commonly known as:  DITROPAN Take 1 tablet (5 mg total) by mouth every 8 (eight) hours as needed for bladder spasms.   simvastatin 10 MG tablet Commonly known as:  ZOCOR Take 10 mg by mouth daily.   vitamin B-12 250 MCG tablet Commonly known as:  CYANOCOBALAMIN Take 250 mcg by mouth every evening.   vitamin B-6 250 MG tablet Take 250 mg by mouth every evening.       Allergies: No Known Allergies  Family History: Family History  Problem Relation Age of Onset  . Cancer Father     lung cancer - deceased  . Cancer Sister 53    breast cancer - alive and well  . Breast cancer Sister 93  . Breast cancer Paternal Aunt   . Colon cancer Neg Hx   . Ovarian cancer Neg Hx   . Hematuria Neg Hx   . Prostate cancer Neg Hx   . Bladder Cancer Neg Hx     Social History:  reports that she quit smoking about 10 years ago. She has never used smokeless tobacco. She reports that she does not drink alcohol or use drugs.  ROS: UROLOGY Frequent Urination?: No Hard to postpone urination?: No Burning/pain with urination?: No Get up at night to urinate?: No Leakage of urine?: No Urine stream starts and stops?: No Trouble starting stream?: No Do you have to strain to urinate?: No Blood in urine?: No Urinary tract infection?: No Sexually transmitted disease?: No Injury to kidneys or bladder?: No Painful intercourse?: No Weak stream?: No Currently pregnant?: No Vaginal bleeding?: No Last menstrual period?: n  Gastrointestinal Nausea?: No Vomiting?: No Indigestion/heartburn?: No Diarrhea?: No Constipation?: No  Constitutional Fever: No Night sweats?: No Weight loss?: No Fatigue?: No  Skin Skin rash/lesions?: No Itching?: No  Eyes Blurred vision?: No Double vision?:  No  Ears/Nose/Throat Sore throat?: No Sinus problems?: No  Hematologic/Lymphatic Swollen glands?: No Easy bruising?: No  Cardiovascular Leg swelling?: No Chest pain?: No  Respiratory Cough?: No Shortness of breath?: No  Endocrine Excessive thirst?: No  Musculoskeletal Back pain?: No Joint pain?: Yes  Neurological Headaches?: No Dizziness?: No  Psychologic Depression?: No Anxiety?: No  Physical Exam: BP (!) 150/69   Pulse (!) 106   Ht 5\' 4"  (1.626 m)   Wt 146 lb 14.4 oz (66.6 kg)   BMI 25.22 kg/m   Constitutional: Well nourished. Alert and oriented, No acute distress. HEENT: [place] AT, moist mucus membranes. Trachea midline, no masses. Cardiovascular:  No clubbing, cyanosis, or edema. Respiratory: Normal respiratory effort, no increased work of breathing. GI: Abdomen is soft, non tender, non distended, no abdominal masses. Liver and spleen not palpable.  No hernias appreciated.  Stool sample for occult testing is not indicated.   GU: No CVA tenderness.  No bladder fullness or masses.   Skin: No rashes, bruises or suspicious lesions. Lymph: No cervical or inguinal adenopathy. Neurologic: Grossly intact, no focal deficits, moving all 4 extremities. Psychiatric: Normal mood and affect.  Laboratory Data: Lab Results  Component Value Date   WBC 7.0 08/17/2016   HGB 13.3 08/17/2016   HCT 39.4 08/17/2016   MCV 92.0 08/17/2016   PLT 197 08/17/2016    Lab Results  Component Value Date   CREATININE 0.59 08/16/2016    Lab Results  Component Value Date   AST 31 06/14/2016   Lab Results  Component Value Date   ALT 28 06/14/2016    Urinalysis > 30 WBC's.  > 30 RBC's.  Many bacteria.  See EPIC.   Assessment & Plan:    1. Malignant neoplasm of urinary bladder, unspecified site (HCC)  - pT1 TCC of bladder  - Reviewed BCG treatment course, possible side effects including BCG sepsis, bladder irritation, worsening of her urinary symptoms  - # 4 of 6 BCG NOT  installed today  - RTC in one week for # 4 BCG  - UA  - urine sent for culture - will prescribe antibiotics when culture results are available  - Advised to contact our office or seek treatment in the ED if becomes febrile or pain/ vomiting are difficult control in order to arrange for emergent/urgent intervention   2. UTI  - see above  Return in about 1 week (around 11/23/2016) for # 4 BCG.  These notes generated with voice recognition software. I apologize for typographical errors.  Zara Council, [facility] [address] [place], [place] [address] [phone]

## 2016-11-20 LAB — CULTURE, URINE COMPREHENSIVE

## 2016-11-21 ENCOUNTER — Encounter: Payer: Self-pay | Admitting: Urology

## 2016-11-21 ENCOUNTER — Ambulatory Visit (INDEPENDENT_AMBULATORY_CARE_PROVIDER_SITE_OTHER): Payer: Medicare Other | Admitting: Urology

## 2016-11-21 ENCOUNTER — Telehealth: Payer: Self-pay

## 2016-11-21 VITALS — BP 159/72 | HR 43 | Ht 64.0 in | Wt 146.0 lb

## 2016-11-21 DIAGNOSIS — C679 Malignant neoplasm of bladder, unspecified: Secondary | ICD-10-CM

## 2016-11-21 LAB — URINALYSIS, COMPLETE
BILIRUBIN UA: NEGATIVE
GLUCOSE, UA: NEGATIVE
Ketones, UA: NEGATIVE
Nitrite, UA: POSITIVE — AB
Protein, UA: NEGATIVE
UUROB: 0.2 mg/dL (ref 0.2–1.0)
pH, UA: 5 (ref 5.0–7.5)

## 2016-11-21 LAB — MICROSCOPIC EXAMINATION

## 2016-11-21 MED ORDER — LIDOCAINE HCL 2 % EX GEL
1.0000 "application " | Freq: Once | CUTANEOUS | Status: AC
  Administered 2016-11-21: 1 via URETHRAL

## 2016-11-21 MED ORDER — BCG LIVE 50 MG IS SUSR
3.2400 mL | Freq: Once | INTRAVESICAL | Status: AC
  Administered 2016-11-21: 81 mg via INTRAVESICAL

## 2016-11-21 NOTE — Telephone Encounter (Signed)
-----   Message from Nori Riis, PA-C sent at 11/20/2016  8:36 PM EDT ----- Please notify the patient that her urine culture was negative.

## 2016-11-21 NOTE — Telephone Encounter (Signed)
Spoke with pt in reference to -ucx. Pt voiced understanding.  

## 2016-11-21 NOTE — Progress Notes (Signed)
11/21/2016 3:53 PM   Kim Espinoza 03-May-1941 [id number]  Referring provider: [name], MD [address] [facility] [place], [place] [address]  Chief Complaint  Patient presents with  . Bladder Cancer    BCG  4    HPI: 76 yo WF who presents today to have her 4 of 6 weekly treatments of BCG.     Patient underwent a transurethral resection of bladder tumor in the operating room on 08/30/2016.  She had a 2 cm papular tumor on the right lateral wall with adjacent 1 cm satellite tumor near the bladder neck. These were found during hematuria workup that was otherwise negative. 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 It was indeterminate if there was muscularispropria in the specimen. However, during the surgery stent was seen in the bed of the resection site.    She underwent repeat TURBT due to her stage. There was muscle in the specimen however there is no residual carcinoma.  Today, she is not having urinary complaints.  She specifically denies dysuria, gross hematuria and suprapubic pain.  She has not had fevers, chills, nausea and vomiting.  She does not have a new cough.  UA was suspicious last week for an infection, so her BCG installment was postponed. Urine culture returned negative and she presents today for BCG installation.   UA today demonstrates > 30 WBC's, 3-10 RBC's, nitrite positive and many bacteria.    PMH: Past Medical History:  Diagnosis Date  . Abnormal mammogram 2011, 2012   Right side.   . Anxiety   . Breast screening, unspecified   . Cancer ([place])    "bladder stage 1"  . Hypercholesterolemia since 2001  . Hypertension    since approx. 2001  . Mammographic microcalcification   . Personal history of tobacco use, presenting hazards to health   . Special screening for malignant neoplasms, colon   . 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 BREAST BIOPSY Right ??   x2 - core w/clip - neg  . BREAST BIOPSY-right  2011   Stereotactic - Dr. Bary Castilla- Benign breast tissue containing stromal calcifications wiithin.  Periductal Fibrosis.  Marland Kitchen BREAST SURGERY Right 2011, 2013   stero biopsy  . CAROTID ENDARTERECTOMY  2004  . CATARACT EXTRACTION EXTRACAPSULAR  2013   Left eye  . COLONOSCOPY  2012   Dr. Bary Castilla  . EYE SURGERY Left    cataract   . TRANSURETHRAL RESECTION OF BLADDER TUMOR N/A 08/30/2016   Procedure: TRANSURETHRAL RESECTION OF BLADDER TUMOR (TURBT) SMALL;  Surgeon: [name], MD;  Location: [facility] ORS;  Service: Urology;  Laterality: N/A;  . TRANSURETHRAL RESECTION OF BLADDER TUMOR WITH MITOMYCIN-C N/A 07/11/2016   Procedure: TRANSURETHRAL RESECTION OF BLADDER TUMOR WITH MITOMYCIN-C;  Surgeon: [name], MD;  Location: [facility] ORS;  Service: Urology;  Laterality: N/A;  . TUBAL LIGATION    . VASCULAR SURGERY Left 2004   carotid endarderectomy    Home Medications:  Allergies as of 11/21/2016   No Known Allergies     Medication List       Accurate as of 11/21/16  3:53 PM. Always use your most recent med list.          ALPRAZolam 0.25 MG tablet Commonly known as:  XANAX Take 0.25 mg by mouth daily as needed for anxiety.   amLODipine 5 MG tablet Commonly known  as:  NORVASC Take by mouth.   amLODipine 5 MG tablet Commonly known as:  NORVASC Take 5 mg by mouth once. Am of surgery   aspirin EC 81 MG tablet Take 81 mg by mouth every evening.   atenolol 50 MG tablet Commonly known as:  TENORMIN Take 50 mg by mouth 2 (two) times daily.   B-complex with vitamin C tablet Take 1 tablet by mouth every evening.   cephALEXin 500 MG capsule Commonly known as:  KEFLEX Take 1 capsule (500 mg total) by mouth 3 (three) times daily.   Flaxseed Oil 1000 MG Caps Take 1,000 mg by mouth every evening.   FOLIC ACID PO Take 1 tablet by mouth daily.   HYDROcodone-acetaminophen 5-325 MG tablet Commonly known  as:  NORCO/VICODIN Take 1-2 tablets by mouth every 4 (four) hours as needed for moderate pain.   nortriptyline 25 MG capsule Commonly known as:  PAMELOR Take 25 mg by mouth at bedtime.   omeprazole 10 MG capsule Commonly known as:  PRILOSEC Take by mouth.   oxybutynin 5 MG tablet Commonly known as:  DITROPAN Take 1 tablet (5 mg total) by mouth every 8 (eight) hours as needed for bladder spasms.   simvastatin 10 MG tablet Commonly known as:  ZOCOR Take 10 mg by mouth daily.   vitamin B-12 250 MCG tablet Commonly known as:  CYANOCOBALAMIN Take 250 mcg by mouth every evening.   vitamin B-6 250 MG tablet Take 250 mg by mouth every evening.       Allergies: No Known Allergies  Family History: Family History  Problem Relation Age of Onset  . Cancer Father     lung cancer - deceased  . Cancer Sister 9    breast cancer - alive and well  . Breast cancer Sister 69  . Breast cancer Paternal Aunt   . Colon cancer Neg Hx   . Ovarian cancer Neg Hx   . Hematuria Neg Hx   . Prostate cancer Neg Hx   . Bladder Cancer Neg Hx     Social History:  reports that she quit smoking about 10 years ago. She has never used smokeless tobacco. She reports that she does not drink alcohol or use drugs.  ROS: UROLOGY Frequent Urination?: No Hard to postpone urination?: No Burning/pain with urination?: No Get up at night to urinate?: No Leakage of urine?: No Urine stream starts and stops?: No Trouble starting stream?: No Do you have to strain to urinate?: No Blood in urine?: No Urinary tract infection?: No Sexually transmitted disease?: No Injury to kidneys or bladder?: No Painful intercourse?: No Weak stream?: No Currently pregnant?: No Vaginal bleeding?: No Last menstrual period?: n  Gastrointestinal Nausea?: No Vomiting?: No Indigestion/heartburn?: No Diarrhea?: No Constipation?: No  Constitutional Fever: No Night sweats?: No Weight loss?: No Fatigue?:  No  Skin Skin rash/lesions?: No Itching?: No  Eyes Blurred vision?: No Double vision?: No  Ears/Nose/Throat Sore throat?: No Sinus problems?: No  Hematologic/Lymphatic Swollen glands?: No Easy bruising?: No  Cardiovascular Leg swelling?: Yes Chest pain?: No  Respiratory Cough?: No Shortness of breath?: No  Endocrine Excessive thirst?: No  Musculoskeletal Back pain?: No Joint pain?: Yes  Neurological Headaches?: No Dizziness?: No  Psychologic Depression?: No Anxiety?: No  Physical Exam: BP (!) 159/72   Pulse (!) 43   Ht 5\' 4"  (1.626 m)   Wt 146 lb (66.2 kg)   BMI 25.06 kg/m   Constitutional: Well nourished. Alert and oriented, No acute distress.  HEENT: [place] AT, moist mucus membranes. Trachea midline, no masses. Cardiovascular: No clubbing, cyanosis, or edema. Respiratory: Normal respiratory effort, no increased work of breathing. GI: Abdomen is soft, non tender, non distended, no abdominal masses. Liver and spleen not palpable.  No hernias appreciated.  Stool sample for occult testing is not indicated.   GU: No CVA tenderness.  No bladder fullness or masses.   Skin: No rashes, bruises or suspicious lesions. Lymph: No cervical or inguinal adenopathy. Neurologic: Grossly intact, no focal deficits, moving all 4 extremities. Psychiatric: Normal mood and affect.  Laboratory Data: Lab Results  Component Value Date   WBC 7.0 08/17/2016   HGB 13.3 08/17/2016   HCT 39.4 08/17/2016   MCV 92.0 08/17/2016   PLT 197 08/17/2016    Lab Results  Component Value Date   CREATININE 0.59 08/16/2016    Lab Results  Component Value Date   AST 31 06/14/2016   Lab Results  Component Value Date   ALT 28 06/14/2016    Urinalysis > 30 WBC's.  3-10 RBC's.  Nitrite positive.  Many bacteria.  See EPIC.   Assessment & Plan:    1. Malignant neoplasm of urinary bladder, unspecified site (HCC)  - pT1 TCC of bladder  - Reviewed BCG treatment course, possible  side effects including BCG sepsis, bladder irritation, worsening of her urinary symptoms  - Advised to contact our office or seek treatment in the ED if becomes febrile or pain/ vomiting are difficult control in order to arrange for emergent/urgent intervention   - Reviewed BCG treatment course, possible side effects including BCG sepsis, bladder irritation, worsening of her urinary symptoms  - # 4 of 6 BCG installed today  - Patient was instructed to pour bleach down her toilet for the next 6 hours  -  Instructed to call the office if she should experience fevers greater than 102, chills/rigors, onset of a new cough, night sweats or further bladder spasms or inability to urinate   - RTC in one week for # 5 BCG  - Surveillance protocol also discussed today including cystoscopy every 3 months for at least 2 years and then spread out thereafter  - Urinalysis, Complete  - bcg vaccine injection 81 mg; Instill 3.24 mLs (81 mg total) into the bladder once.  - lidocaine (XYLOCAINE) 2 % jelly 1 application; Place 1 application into the urethra once.  Return in about 1 week (around 11/28/2016) for # 5 BCG.  These notes generated with voice recognition software. I apologize for typographical errors.  Zara Council, [facility] [address], [place], [place] [address] [phone]

## 2016-11-21 NOTE — Progress Notes (Signed)
BCG Bladder Instillation  BCG # 4  Due to Bladder Cancer patient is present today for a BCG treatment. Patient was cleaned and prepped in a sterile fashion with betadine and lidocaine 2% jelly was instilled into the urethra.  A 16FR catheter was inserted, urine return was noted 4ml, urine was yellow in color.  97ml of reconstituted BCG was instilled into the bladder. The catheter was then removed. Patient tolerated well, no complications were noted.  Preformed by: Zara Council PA-C and Lyndee Hensen CMA  Follow up/ Additional notes: One week

## 2016-11-23 ENCOUNTER — Ambulatory Visit: Payer: Medicare Other | Admitting: Urology

## 2016-11-29 NOTE — Progress Notes (Signed)
11/30/2016 4:00 PM   DEZIRAE SERVICE November 02, 1940 [id number]  Referring provider: [name], MD [address] [facility] [place], [place] [address]  Chief Complaint  Patient presents with  . Bladder Cancer    BCG 5    HPI: 76 yo WF who presents today to have her 5th of 6 weekly treatments of BCG.     Patient underwent a transurethral resection of bladder tumor in the operating room on 08/30/2016.  She had a 2 cm papular tumor on the right lateral wall with adjacent 1 cm satellite tumor near the bladder neck. These were found during hematuria workup that was otherwise negative. 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 It was indeterminate if there was muscularispropria in the specimen. However, during the surgery stent was seen in the bed of the resection site.    She underwent repeat TURBT due to her stage. There was muscle in the specimen however there is no residual carcinoma.  Today, she is not having urinary complaints.  She specifically denies dysuria, gross hematuria and suprapubic pain.  She has not had fevers, chills, nausea and vomiting.  She does not have a new cough.  UA today demonstrates > 30 WBC's and many bacteria.    PMH: Past Medical History:  Diagnosis Date  . Abnormal mammogram 2011, 2012   Right side.   . Anxiety   . Breast screening, unspecified   . Cancer ([place])    "bladder stage 1"  . Hypercholesterolemia since 2001  . Hypertension    since approx. 2001  . Mammographic microcalcification   . Personal history of tobacco use, presenting hazards to health   . Special screening for malignant neoplasms, colon   . 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 BREAST BIOPSY Right ??   x2 - core w/clip - neg  . BREAST BIOPSY-right  2011   Stereotactic - Dr. Bary Castilla- Benign breast tissue containing stromal calcifications wiithin.  Periductal  Fibrosis.  Marland Kitchen BREAST SURGERY Right 2011, 2013   stero biopsy  . CAROTID ENDARTERECTOMY  2004  . CATARACT EXTRACTION EXTRACAPSULAR  2013   Left eye  . COLONOSCOPY  2012   Dr. Bary Castilla  . EYE SURGERY Left    cataract   . TRANSURETHRAL RESECTION OF BLADDER TUMOR N/A 08/30/2016   Procedure: TRANSURETHRAL RESECTION OF BLADDER TUMOR (TURBT) SMALL;  Surgeon: [name], MD;  Location: [facility] ORS;  Service: Urology;  Laterality: N/A;  . TRANSURETHRAL RESECTION OF BLADDER TUMOR WITH MITOMYCIN-C N/A 07/11/2016   Procedure: TRANSURETHRAL RESECTION OF BLADDER TUMOR WITH MITOMYCIN-C;  Surgeon: [name], MD;  Location: [facility] ORS;  Service: Urology;  Laterality: N/A;  . TUBAL LIGATION    . VASCULAR SURGERY Left 2004   carotid endarderectomy    Home Medications:  Allergies as of 11/30/2016   No Known Allergies     Medication List       Accurate as of 11/30/16  4:00 PM. Always use your most recent med list.          ALPRAZolam 0.25 MG tablet Commonly known as:  XANAX Take 0.25 mg by mouth daily as needed for anxiety.   amLODipine 5 MG tablet Commonly known as:  NORVASC Take by mouth.   amLODipine 5 MG tablet Commonly known as:  NORVASC Take 5 mg by mouth once. Am of surgery   aspirin EC 81 MG tablet  Take 81 mg by mouth every evening.   atenolol 50 MG tablet Commonly known as:  TENORMIN Take 50 mg by mouth 2 (two) times daily.   B-complex with vitamin C tablet Take 1 tablet by mouth every evening.   BCG vaccine 81 MG/VIAL injection Commonly known as:  THERACYS by Intravesical route.   cephALEXin 500 MG capsule Commonly known as:  KEFLEX Take 1 capsule (500 mg total) by mouth 3 (three) times daily.   Flaxseed Oil 1000 MG Caps Take 1,000 mg by mouth every evening.   FOLIC ACID PO Take 1 tablet by mouth daily.   HYDROcodone-acetaminophen 5-325 MG tablet Commonly known as:  NORCO/VICODIN Take 1-2 tablets by mouth every 4 (four) hours as needed for moderate  pain.   nortriptyline 25 MG capsule Commonly known as:  PAMELOR Take 25 mg by mouth at bedtime.   omeprazole 10 MG capsule Commonly known as:  PRILOSEC Take by mouth.   oxybutynin 5 MG tablet Commonly known as:  DITROPAN Take 1 tablet (5 mg total) by mouth every 8 (eight) hours as needed for bladder spasms.   simvastatin 10 MG tablet Commonly known as:  ZOCOR Take 10 mg by mouth daily.   vitamin B-12 250 MCG tablet Commonly known as:  CYANOCOBALAMIN Take 250 mcg by mouth every evening.   vitamin B-6 250 MG tablet Take 250 mg by mouth every evening.       Allergies: No Known Allergies  Family History: Family History  Problem Relation Age of Onset  . Cancer Father     lung cancer - deceased  . Cancer Sister 12    breast cancer - alive and well  . Breast cancer Sister 37  . Breast cancer Paternal Aunt   . Colon cancer Neg Hx   . Ovarian cancer Neg Hx   . Hematuria Neg Hx   . Prostate cancer Neg Hx   . Bladder Cancer Neg Hx     Social History:  reports that she quit smoking about 10 years ago. She has never used smokeless tobacco. She reports that she does not drink alcohol or use drugs.  ROS: UROLOGY Frequent Urination?: No Hard to postpone urination?: No Burning/pain with urination?: No Get up at night to urinate?: No Leakage of urine?: No Urine stream starts and stops?: No Trouble starting stream?: No Do you have to strain to urinate?: No Blood in urine?: No Urinary tract infection?: No Sexually transmitted disease?: No Injury to kidneys or bladder?: No Painful intercourse?: No Weak stream?: No Currently pregnant?: No Vaginal bleeding?: No Last menstrual period?: n  Gastrointestinal Nausea?: No Vomiting?: No Indigestion/heartburn?: No Diarrhea?: No Constipation?: No  Constitutional Fever: No Night sweats?: No Weight loss?: No Fatigue?: No  Skin Skin rash/lesions?: No Itching?: No  Eyes Blurred vision?: No Double vision?:  No  Ears/Nose/Throat Sore throat?: No Sinus problems?: No  Hematologic/Lymphatic Swollen glands?: No Easy bruising?: No  Cardiovascular Leg swelling?: No Chest pain?: No  Respiratory Cough?: No Shortness of breath?: No  Endocrine Excessive thirst?: No  Musculoskeletal Back pain?: No Joint pain?: No  Neurological Headaches?: No Dizziness?: No  Psychologic Depression?: No Anxiety?: No  Physical Exam: BP (!) 155/66   Pulse 98   Ht 5\' 4"  (1.626 m)   Wt 142 lb 9.6 oz (64.7 kg)   BMI 24.48 kg/m   Constitutional: Well nourished. Alert and oriented, No acute distress. HEENT: [place] AT, moist mucus membranes. Trachea midline, no masses. Cardiovascular: No clubbing, cyanosis, or edema. Respiratory:  Normal respiratory effort, no increased work of breathing. GI: Abdomen is soft, non tender, non distended, no abdominal masses. Liver and spleen not palpable.  No hernias appreciated.  Stool sample for occult testing is not indicated.   GU: No CVA tenderness.  No bladder fullness or masses.   Skin: No rashes, bruises or suspicious lesions. Lymph: No cervical or inguinal adenopathy. Neurologic: Grossly intact, no focal deficits, moving all 4 extremities. Psychiatric: Normal mood and affect.  Laboratory Data: Lab Results  Component Value Date   WBC 7.0 08/17/2016   HGB 13.3 08/17/2016   HCT 39.4 08/17/2016   MCV 92.0 08/17/2016   PLT 197 08/17/2016    Lab Results  Component Value Date   CREATININE 0.59 08/16/2016    Lab Results  Component Value Date   AST 31 06/14/2016   Lab Results  Component Value Date   ALT 28 06/14/2016    Urinalysis > 30 WBC's.  Many bacteria.  See EPIC.   Assessment & Plan:    1. Malignant neoplasm of urinary bladder, unspecified site (HCC)  - pT1 TCC of bladder  - Reviewed BCG treatment course, possible side effects including BCG sepsis, bladder irritation, worsening of her urinary symptoms  - Advised to contact our office or  seek treatment in the ED if becomes febrile or pain/ vomiting are difficult control in order to arrange for emergent/urgent intervention   - Reviewed BCG treatment course, possible side effects including BCG sepsis, bladder irritation, worsening of her urinary symptoms  - # 5 of 6 BCG installed today  - Patient was instructed to pour bleach down her toilet for the next 6 hours  -  Instructed to call the office if she should experience fevers greater than 102, chills/rigors, onset of a new cough, night sweats or further bladder spasms or inability to urinate   - RTC in one week for # 6 BCG  - Surveillance protocol also discussed today including cystoscopy every 3 months for at least 2 years and then spread out thereafter  - Urinalysis, Complete  - bcg vaccine injection 81 mg; Instill 3.24 mLs (81 mg total) into the bladder once.  - lidocaine (XYLOCAINE) 2 % jelly 1 application; Place 1 application into the urethra once.  Return in about 1 week (around 12/07/2016).  These notes generated with voice recognition software. I apologize for typographical errors.  Zara Council, [facility] [address] [place], [place] [address] [phone]

## 2016-11-30 ENCOUNTER — Ambulatory Visit (INDEPENDENT_AMBULATORY_CARE_PROVIDER_SITE_OTHER): Payer: Medicare Other | Admitting: Urology

## 2016-11-30 ENCOUNTER — Encounter: Payer: Self-pay | Admitting: Urology

## 2016-11-30 VITALS — BP 155/66 | HR 98 | Ht 64.0 in | Wt 142.6 lb

## 2016-11-30 DIAGNOSIS — C679 Malignant neoplasm of bladder, unspecified: Secondary | ICD-10-CM

## 2016-11-30 LAB — MICROSCOPIC EXAMINATION
RBC, UA: NONE SEEN /hpf (ref 0–?)
WBC, UA: >30 /hpf — ABNORMAL HIGH (ref 0–?)

## 2016-11-30 LAB — URINALYSIS, COMPLETE
BILIRUBIN UA: NEGATIVE
GLUCOSE, UA: NEGATIVE
KETONES UA: NEGATIVE
Nitrite, UA: NEGATIVE
Protein, UA: NEGATIVE
SPEC GRAV UA: 1.025 (ref 1.005–1.030)
Urobilinogen, Ur: 0.2 mg/dL (ref 0.2–1.0)
pH, UA: 5 (ref 5.0–7.5)

## 2016-11-30 MED ORDER — BCG LIVE 50 MG IS SUSR
3.2400 mL | Freq: Once | INTRAVESICAL | Status: AC
  Administered 2016-11-30: 81 mg via INTRAVESICAL

## 2016-11-30 MED ORDER — LIDOCAINE HCL 2 % EX GEL
1.0000 "application " | Freq: Once | CUTANEOUS | Status: AC
  Administered 2016-11-30: 1 via URETHRAL

## 2016-11-30 NOTE — Progress Notes (Signed)
BCG Bladder Instillation  BCG # 5  Due to Bladder Cancer patient is present today for a BCG treatment. Patient was cleaned and prepped in a sterile fashion with betadine and lidocaine 2% jelly was instilled into the urethra.  A 14FR catheter was inserted, urine return was noted 35ml, urine was yellow in color.  66ml of reconstituted BCG was instilled into the bladder. The catheter was then removed. Patient tolerated well, no complications were noted.  Preformed by: Royden Purl, student and Lyndee Hensen CMA  Follow up/ Additional notes: One week

## 2016-12-07 ENCOUNTER — Ambulatory Visit: Payer: Medicare Other | Admitting: Urology

## 2016-12-07 ENCOUNTER — Encounter: Payer: Self-pay | Admitting: Urology

## 2016-12-07 VITALS — BP 137/85 | HR 99 | Ht 62.0 in | Wt 145.5 lb

## 2016-12-07 DIAGNOSIS — C679 Malignant neoplasm of bladder, unspecified: Secondary | ICD-10-CM | POA: Diagnosis not present

## 2016-12-07 LAB — URINALYSIS, COMPLETE
Bilirubin, UA: NEGATIVE
GLUCOSE, UA: NEGATIVE
KETONES UA: NEGATIVE
NITRITE UA: NEGATIVE
PROTEIN UA: NEGATIVE
SPEC GRAV UA: 1.02 (ref 1.005–1.030)
UUROB: 0.2 mg/dL (ref 0.2–1.0)
pH, UA: 5 (ref 5.0–7.5)

## 2016-12-07 LAB — MICROSCOPIC EXAMINATION: WBC, UA: >30 /hpf — ABNORMAL HIGH (ref 0–?)

## 2016-12-07 MED ORDER — BCG LIVE 50 MG IS SUSR
3.2400 mL | Freq: Once | INTRAVESICAL | Status: AC
  Administered 2016-12-07: 81 mg via INTRAVESICAL

## 2016-12-07 MED ORDER — LIDOCAINE HCL 2 % EX GEL
1.0000 "application " | Freq: Once | CUTANEOUS | Status: AC
  Administered 2016-12-07: 1 via URETHRAL

## 2016-12-07 NOTE — Progress Notes (Signed)
12/07/2016 2:50 PM   Kim Espinoza 1941-06-23 [id number]  Referring provider: [name], MD [address] [facility] [place], [place] [address]  Chief Complaint  Patient presents with  . Bladder Cancer    BCG 6    HPI: 76 yo WF who presents today to have her 5th of 6 weekly treatments of BCG.     Patient underwent a transurethral resection of bladder tumor in the operating room on 08/30/2016.  She had a 2 cm papular tumor on the right lateral wall with adjacent 1 cm satellite tumor near the bladder neck. These were found during hematuria workup that was otherwise negative. 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 It was indeterminate if there was muscularispropria in the specimen. However, during the surgery stent was seen in the bed of the resection site.    She underwent repeat TURBT due to her stage. There was muscle in the specimen however there is no residual carcinoma.  Today, she is not having urinary complaints.  She specifically denies dysuria, gross hematuria and suprapubic pain.  She has not had fevers, chills, nausea and vomiting.  She does not have a new cough.  UA today demonstrates > 30 WBC's, 3-10 RBC's and few bacteria.    PMH: Past Medical History:  Diagnosis Date  . Abnormal mammogram 2011, 2012   Right side.   . Anxiety   . Breast screening, unspecified   . Cancer ([place])    "bladder stage 1"  . Hypercholesterolemia since 2001  . Hypertension    since approx. 2001  . Mammographic microcalcification   . Personal history of tobacco use, presenting hazards to health   . Special screening for malignant neoplasms, colon   . 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 BREAST BIOPSY Right ??   x2 - core w/clip - neg  . BREAST BIOPSY-right  2011   Stereotactic - Dr. Bary Castilla- Benign breast tissue containing stromal calcifications wiithin.   Periductal Fibrosis.  Marland Kitchen BREAST SURGERY Right 2011, 2013   stero biopsy  . CAROTID ENDARTERECTOMY  2004  . CATARACT EXTRACTION EXTRACAPSULAR  2013   Left eye  . COLONOSCOPY  2012   Dr. Bary Castilla  . EYE SURGERY Left    cataract   . TRANSURETHRAL RESECTION OF BLADDER TUMOR N/A 08/30/2016   Procedure: TRANSURETHRAL RESECTION OF BLADDER TUMOR (TURBT) SMALL;  Surgeon: [name], MD;  Location: [facility] ORS;  Service: Urology;  Laterality: N/A;  . TRANSURETHRAL RESECTION OF BLADDER TUMOR WITH MITOMYCIN-C N/A 07/11/2016   Procedure: TRANSURETHRAL RESECTION OF BLADDER TUMOR WITH MITOMYCIN-C;  Surgeon: [name], MD;  Location: [facility] ORS;  Service: Urology;  Laterality: N/A;  . TUBAL LIGATION    . VASCULAR SURGERY Left 2004   carotid endarderectomy    Home Medications:  Allergies as of 12/07/2016   No Known Allergies     Medication List       Accurate as of 12/07/16  2:50 PM. Always use your most recent med list.          ALPRAZolam 0.25 MG tablet Commonly known as:  XANAX Take 0.25 mg by mouth daily as needed for anxiety.   amLODipine 5 MG tablet Commonly known as:  NORVASC Take by mouth.   amLODipine 5 MG tablet Commonly known as:  NORVASC Take 5 mg by mouth once. Am of surgery   aspirin EC 81  MG tablet Take 81 mg by mouth every evening.   atenolol 50 MG tablet Commonly known as:  TENORMIN Take 50 mg by mouth 2 (two) times daily.   B-complex with vitamin C tablet Take 1 tablet by mouth every evening.   BCG vaccine 81 MG/VIAL injection Commonly known as:  THERACYS by Intravesical route.   cephALEXin 500 MG capsule Commonly known as:  KEFLEX Take 1 capsule (500 mg total) by mouth 3 (three) times daily.   Flaxseed Oil 1000 MG Caps Take 1,000 mg by mouth every evening.   FOLIC ACID PO Take 1 tablet by mouth daily.   HYDROcodone-acetaminophen 5-325 MG tablet Commonly known as:  NORCO/VICODIN Take 1-2 tablets by mouth every 4 (four) hours as needed for  moderate pain.   nortriptyline 25 MG capsule Commonly known as:  PAMELOR Take 25 mg by mouth at bedtime.   omeprazole 10 MG capsule Commonly known as:  PRILOSEC Take by mouth.   oxybutynin 5 MG tablet Commonly known as:  DITROPAN Take 1 tablet (5 mg total) by mouth every 8 (eight) hours as needed for bladder spasms.   simvastatin 10 MG tablet Commonly known as:  ZOCOR Take 10 mg by mouth daily.   vitamin B-12 250 MCG tablet Commonly known as:  CYANOCOBALAMIN Take 250 mcg by mouth every evening.   vitamin B-6 250 MG tablet Take 250 mg by mouth every evening.       Allergies: No Known Allergies  Family History: Family History  Problem Relation Age of Onset  . Cancer Father     lung cancer - deceased  . Cancer Sister 62    breast cancer - alive and well  . Breast cancer Sister 52  . Breast cancer Paternal Aunt   . Colon cancer Neg Hx   . Ovarian cancer Neg Hx   . Hematuria Neg Hx   . Prostate cancer Neg Hx   . Bladder Cancer Neg Hx     Social History:  reports that she quit smoking about 10 years ago. She has never used smokeless tobacco. She reports that she does not drink alcohol or use drugs.  ROS: UROLOGY Frequent Urination?: No Hard to postpone urination?: No Burning/pain with urination?: No Get up at night to urinate?: No Leakage of urine?: No Urine stream starts and stops?: No Trouble starting stream?: No Do you have to strain to urinate?: No Blood in urine?: No Urinary tract infection?: No Sexually transmitted disease?: No Injury to kidneys or bladder?: No Painful intercourse?: No Weak stream?: No Currently pregnant?: No Vaginal bleeding?: No Last menstrual period?: n  Gastrointestinal Nausea?: No Vomiting?: No Indigestion/heartburn?: No Diarrhea?: No Constipation?: No  Constitutional Fever: No Night sweats?: No Weight loss?: No Fatigue?: No  Skin Skin rash/lesions?: No Itching?: No  Eyes Blurred vision?: No Double vision?:  No  Ears/Nose/Throat Sore throat?: No Sinus problems?: No  Hematologic/Lymphatic Swollen glands?: No Easy bruising?: No  Cardiovascular Leg swelling?: No Chest pain?: No  Respiratory Cough?: No Shortness of breath?: No  Endocrine Excessive thirst?: No  Musculoskeletal Back pain?: No Joint pain?: Yes  Neurological Headaches?: No Dizziness?: No  Psychologic Depression?: No Anxiety?: No  Physical Exam: BP 137/85   Pulse 99   Ht 5\' 2"  (1.575 m)   Wt 145 lb 8 oz (66 kg)   BMI 26.61 kg/m   Constitutional: Well nourished. Alert and oriented, No acute distress. HEENT: [place] AT, moist mucus membranes. Trachea midline, no masses. Cardiovascular: No clubbing, cyanosis, or edema.  Respiratory: Normal respiratory effort, no increased work of breathing. GI: Abdomen is soft, non tender, non distended, no abdominal masses. Liver and spleen not palpable.  No hernias appreciated.  Stool sample for occult testing is not indicated.   GU: No CVA tenderness.  No bladder fullness or masses.   Skin: No rashes, bruises or suspicious lesions. Lymph: No cervical or inguinal adenopathy. Neurologic: Grossly intact, no focal deficits, moving all 4 extremities. Psychiatric: Normal mood and affect.  Laboratory Data: Lab Results  Component Value Date   WBC 7.0 08/17/2016   HGB 13.3 08/17/2016   HCT 39.4 08/17/2016   MCV 92.0 08/17/2016   PLT 197 08/17/2016    Lab Results  Component Value Date   CREATININE 0.59 08/16/2016    Lab Results  Component Value Date   AST 31 06/14/2016   Lab Results  Component Value Date   ALT 28 06/14/2016    Urinalysis > 30 WBC's.  3-10 RBC's.  Few bacteria.  See EPIC.   Assessment & Plan:    1. Malignant neoplasm of urinary bladder, unspecified site (HCC)  - pT1 TCC of bladder  - Reviewed BCG treatment course, possible side effects including BCG sepsis, bladder irritation, worsening of her urinary symptoms  - Advised to contact our office  or seek treatment in the ED if becomes febrile or pain/ vomiting are difficult control in order to arrange for emergent/urgent intervention   - Reviewed BCG treatment course, possible side effects including BCG sepsis, bladder irritation, worsening of her urinary symptoms  - # 6 of 6 BCG installed today  - Patient was instructed to pour bleach down her toilet for the next 6 hours  -  Instructed to call the office if she should experience fevers greater than 102, chills/rigors, onset of a new cough, night sweats or further bladder spasms or inability to urinate   - RTC cystoscopy on the books for 03/01/2017  - Surveillance protocol also discussed today including cystoscopy every 3 months for at least 2 years and then spread out thereafter (cystoscopy on the books for 03/01/2017)  - Urinalysis, Complete  - bcg vaccine injection 81 mg; Instill 3.24 mLs (81 mg total) into the bladder once.  - lidocaine (XYLOCAINE) 2 % jelly 1 application; Place 1 application into the urethra once.  Return for RTC on 03/01/2017 for cystoscopy.  These notes generated with voice recognition software. I apologize for typographical errors.  Zara Council, [facility] [address] [place], [place] [address] [phone]

## 2016-12-07 NOTE — Progress Notes (Signed)
BCG Bladder Instillation  BCG # 6  Due to Bladder Cancer patient is present today for a BCG treatment. Patient was cleaned and prepped in a sterile fashion with betadine and lidocaine 2% jelly was instilled into the urethra.  A 14FR catheter was inserted, urine return was noted 41ml, urine was yellow in color.  10ml of reconstituted BCG was instilled into the bladder. The catheter was then removed. Patient tolerated well, no complications were noted  Preformed by: Zara Council PA-C and Lyndee Hensen CMA  Follow up/ Additional notes: July for cystoscopy appt already made

## 2016-12-07 NOTE — Addendum Note (Signed)
Addended by: Orlene Erm on: 12/07/2016 04:41 PM   Modules accepted: Orders

## 2016-12-14 ENCOUNTER — Ambulatory Visit: Payer: Medicare Other | Admitting: Urology

## 2017-03-01 ENCOUNTER — Ambulatory Visit: Payer: Medicare Other | Admitting: Urology

## 2017-03-01 ENCOUNTER — Encounter: Payer: Self-pay | Admitting: Urology

## 2017-03-01 VITALS — BP 138/75 | HR 83 | Ht 62.0 in | Wt 148.7 lb

## 2017-03-01 DIAGNOSIS — C678 Malignant neoplasm of overlapping sites of bladder: Secondary | ICD-10-CM

## 2017-03-01 LAB — MICROSCOPIC EXAMINATION: Bacteria, UA: NONE SEEN

## 2017-03-01 LAB — URINALYSIS, COMPLETE
BILIRUBIN UA: NEGATIVE
Glucose, UA: NEGATIVE
KETONES UA: NEGATIVE
Nitrite, UA: NEGATIVE
PH UA: 7 (ref 5.0–7.5)
Protein, UA: NEGATIVE
SPEC GRAV UA: 1.01 (ref 1.005–1.030)
UUROB: 0.2 mg/dL (ref 0.2–1.0)

## 2017-03-01 MED ORDER — LIDOCAINE HCL 2 % EX GEL
1.0000 "application " | Freq: Once | CUTANEOUS | Status: AC
  Administered 2017-03-01: 1 via URETHRAL

## 2017-03-01 MED ORDER — CIPROFLOXACIN HCL 500 MG PO TABS
500.0000 mg | ORAL_TABLET | Freq: Once | ORAL | Status: AC
  Administered 2017-03-01: 500 mg via ORAL

## 2017-03-01 NOTE — Progress Notes (Signed)
   03/01/17  CC:  Chief Complaint  Patient presents with  . Cysto    HPI: The patient presents today for post induction BCG follow up. She had a 2 cm papular tumor on the right lateral wall with adjacent 1 cm satellite tumor near the bladder neck. These were found during hematuria workup that was otherwise negative. 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 It was indeterminate if there was muscularispropria in the specimen.   She underwent repeat TURBT due to her stage. There was muscle in the specimen however there is no residual carcinoma.  She completed induction BCG in April 2018 presents today for follow-up cystoscopy.  Blood pressure 138/75, pulse 83, height 5\' 2"  (1.575 m), weight 148 lb 11.2 oz (67.4 kg). NED. A&Ox3.   No respiratory distress   Abd soft, NT, ND Normal external genitalia with patent urethral meatus  Cystoscopy Procedure Note  Patient identification was confirmed, informed consent was obtained, and patient was prepped using Betadine solution.  Lidocaine jelly was administered per urethral meatus.    Preoperative abx where received prior to procedure.    Procedure: - Flexible cystoscope introduced, without any difficulty.   - Thorough search of the bladder revealed:    normal urethral meatus    -Patient has a necrotic lesion on the right lateral wall inapparent TURBT scar. She also has an erythematous 2-3 cm lesion on the left upper lateral wall concerning for a new neoplasm. There are various erythematous patches at the bladder that are   possibly CIS. - Ureteral orifices were normal in position and appearance.  Post-Procedure: - Patient tolerated the procedure well  Assessment/ Plan:  1. Bladder cancer I discussed the patient that her cystoscopy findings today are concerning for post induction BCG recurrence in for cystoscopy. I discussed that the next step would be repeat TURBT of these lesions. We did  discuss future treatment recommendations would be pending the results of repeat TURBT. All questions were answered. The patient is agreeable to proceeding with TURBT which she has been through this two times in the past.  [name], MD

## 2017-03-02 ENCOUNTER — Telehealth: Payer: Self-pay | Admitting: Radiology

## 2017-03-02 ENCOUNTER — Other Ambulatory Visit: Payer: Self-pay | Admitting: Radiology

## 2017-03-02 DIAGNOSIS — C678 Malignant neoplasm of overlapping sites of bladder: Secondary | ICD-10-CM

## 2017-03-02 NOTE — Telephone Encounter (Signed)
Pt scheduled for TURBT with Dr Pilar Jarvis on 03/14/17. Made pt aware of surgery, pre-admit testing appt & post op appt with Dr Pilar Jarvis. Advised pt to hold ASA 81mg  beginning 03/07/17 per Dr Pilar Jarvis. Pt voices understanding.

## 2017-03-03 LAB — URINE CULTURE

## 2017-03-07 ENCOUNTER — Encounter
Admission: RE | Admit: 2017-03-07 | Discharge: 2017-03-07 | Disposition: A | Discharge status: Home / Self Care | Payer: Medicare Other | Source: Ambulatory Visit | Attending: Urology | Admitting: Urology

## 2017-03-07 DIAGNOSIS — C679 Malignant neoplasm of bladder, unspecified: Secondary | ICD-10-CM | POA: Diagnosis not present

## 2017-03-07 DIAGNOSIS — Z01812 Encounter for preprocedural laboratory examination: Secondary | ICD-10-CM | POA: Insufficient documentation

## 2017-03-07 LAB — BASIC METABOLIC PANEL
Anion gap: 7 (ref 5–15)
BUN: 8 mg/dL (ref 6–20)
CALCIUM: 9.8 mg/dL (ref 8.9–10.3)
CHLORIDE: 104 mmol/L (ref 101–111)
CO2: 28 mmol/L (ref 22–32)
Creatinine, Ser: 0.65 mg/dL (ref 0.44–1.00)
GFR calc Af Amer: >60 mL/min (ref >60)
GFR calc non Af Amer: >60 mL/min (ref >60)
Glucose, Bld: 95 mg/dL (ref 65–99)
Potassium: 4 mmol/L (ref 3.5–5.1)
SODIUM: 139 mmol/L (ref 135–145)

## 2017-03-07 LAB — CBC
HCT: 41.5 % (ref 35.0–47.0)
Hemoglobin: 14 g/dL (ref 12.0–16.0)
MCH: 30.4 pg (ref 26.0–34.0)
MCHC: 33.7 g/dL (ref 32.0–36.0)
MCV: 90.5 fL (ref 80.0–100.0)
Platelets: 214 10*3/uL (ref 150–440)
RBC: 4.58 MIL/uL (ref 3.80–5.20)
RDW: 14.6 % — AB (ref 11.5–14.5)
WBC: 8 10*3/uL (ref 3.6–11.0)

## 2017-03-07 NOTE — Pre-Procedure Instructions (Signed)
ECG 12-lead  11/27/2016 [place] Component Name Value Ref Range  Vent Rate (bpm) 76   QRS Interval (msec) 80   QT Interval (msec) 396   QTc (msec) 445   Result Narrative  Undetermined rhythm Otherwise normal ECG No previous ECGs available I reviewed and concur with this report. Electronically signed [email or web address], MD, Jeneen Rinks [phone]) on 03/05/2017 7:28:01 AM   Above EKG results copied and pasted from [place].

## 2017-03-07 NOTE — Patient Instructions (Signed)
Your procedure is scheduled on: Wednesday, March 14, 2017 Report to Same Day Surgery on the 2nd floor in the [facility]. To find out your arrival time, please call [phone] between 1PM - 3PM on: Tuesday, July 24  REMEMBER: Instructions that are not followed completely may result in serious medical risk up to and including death; or upon the discretion of your surgeon and anesthesiologist your surgery may need to be rescheduled.  Do not eat food or drink liquids after midnight. No gum chewing or hard candies  No Alcohol for 24 hours before or after surgery.  No Smoking for 24 hours prior to surgery.  Notify your doctor if there is any change in your medical condition (cold, fever, infection).  Do not wear jewelry, make-up, hairpins, clips or nail polish.  Do not wear lotions, powders, or perfumes.   Do not shave 48 hours prior to surgery.   Do not bring valuables to the hospital. [facility] is not responsible for any belongings or valuables.   TAKE THESE MEDICATIONS THE MORNING OF SURGERY WITH A SIP OF WATER:  1.  AMLODIPINE 2.  ATENOLOL  STOP TAKING ASPIRIN NOW  (03/07/17)  Stop Anti-inflammatories such as Advil, Aleve, Ibuprofen, Motrin, Naproxen, Naprosyn, Goodie powder, or aspirin products.  (May take Tylenol or Acetaminophen if needed for pain.)  Stop supplements until after surgery. FLAXSEED (May continue Vitamin B, and multivitamin.)  If you are being discharged the day of surgery, you will not be allowed to drive home. You will need someone to drive you home and stay with you that night.   If you are taking public transportation, you will need to have a responsible adult to with you.

## 2017-03-13 MED ORDER — CEFAZOLIN SODIUM-DEXTROSE 2-4 GM/100ML-% IV SOLN
2.0000 g | INTRAVENOUS | Status: AC
  Administered 2017-03-14: 2 g via INTRAVENOUS

## 2017-03-14 ENCOUNTER — Ambulatory Visit
Admission: RE | Admit: 2017-03-14 | Discharge: 2017-03-14 | Disposition: A | Discharge status: Home / Self Care | Payer: Medicare Other | Source: Ambulatory Visit | Attending: Urology | Admitting: Urology

## 2017-03-14 ENCOUNTER — Ambulatory Visit: Payer: Medicare Other | Admitting: Certified Registered"

## 2017-03-14 ENCOUNTER — Encounter: Payer: Self-pay | Admitting: *Deleted

## 2017-03-14 ENCOUNTER — Encounter
Admission: RE | Disposition: A | Discharge status: Home / Self Care | Payer: Self-pay | Source: Ambulatory Visit | Attending: Urology

## 2017-03-14 DIAGNOSIS — D494 Neoplasm of unspecified behavior of bladder: Secondary | ICD-10-CM | POA: Diagnosis not present

## 2017-03-14 DIAGNOSIS — C678 Malignant neoplasm of overlapping sites of bladder: Secondary | ICD-10-CM

## 2017-03-14 DIAGNOSIS — I10 Essential (primary) hypertension: Secondary | ICD-10-CM | POA: Insufficient documentation

## 2017-03-14 DIAGNOSIS — Z8551 Personal history of malignant neoplasm of bladder: Secondary | ICD-10-CM | POA: Insufficient documentation

## 2017-03-14 DIAGNOSIS — N3090 Cystitis, unspecified without hematuria: Secondary | ICD-10-CM | POA: Insufficient documentation

## 2017-03-14 DIAGNOSIS — I739 Peripheral vascular disease, unspecified: Secondary | ICD-10-CM | POA: Diagnosis not present

## 2017-03-14 DIAGNOSIS — Z87891 Personal history of nicotine dependence: Secondary | ICD-10-CM | POA: Diagnosis not present

## 2017-03-14 HISTORY — PX: TRANSURETHRAL RESECTION OF BLADDER TUMOR: SHX2575

## 2017-03-14 SURGERY — TURBT (TRANSURETHRAL RESECTION OF BLADDER TUMOR)
Anesthesia: General

## 2017-03-14 MED ORDER — CEPHALEXIN 500 MG PO CAPS: 500.0000 mg | ORAL_CAPSULE | Freq: Three times a day (TID) | ORAL | 0 refills | Status: DC

## 2017-03-14 MED ORDER — ONDANSETRON HCL 4 MG/2ML IJ SOLN: 4.0000 mg | Freq: Once | INTRAMUSCULAR | Status: DC | PRN

## 2017-03-14 MED ORDER — PHENYLEPHRINE HCL 10 MG/ML IJ SOLN
INTRAMUSCULAR | Status: AC
  Filled 2017-03-14: qty 1

## 2017-03-14 MED ORDER — DEXAMETHASONE SODIUM PHOSPHATE 10 MG/ML IJ SOLN
INTRAMUSCULAR | Status: DC | PRN
  Administered 2017-03-14: 10 mg via INTRAVENOUS

## 2017-03-14 MED ORDER — MIDAZOLAM HCL 2 MG/2ML IJ SOLN
INTRAMUSCULAR | Status: DC | PRN
  Administered 2017-03-14: 2 mg via INTRAVENOUS

## 2017-03-14 MED ORDER — FAMOTIDINE 20 MG PO TABS
ORAL_TABLET | ORAL | Status: AC
  Administered 2017-03-14: 20 mg via ORAL
  Filled 2017-03-14: qty 1

## 2017-03-14 MED ORDER — FENTANYL CITRATE (PF) 100 MCG/2ML IJ SOLN
INTRAMUSCULAR | Status: DC | PRN
  Administered 2017-03-14: 50 ug via INTRAVENOUS

## 2017-03-14 MED ORDER — MIDAZOLAM HCL 2 MG/2ML IJ SOLN
INTRAMUSCULAR | Status: AC
  Filled 2017-03-14: qty 2

## 2017-03-14 MED ORDER — PROPOFOL 10 MG/ML IV BOLUS
INTRAVENOUS | Status: DC | PRN
  Administered 2017-03-14: 120 mg via INTRAVENOUS

## 2017-03-14 MED ORDER — GLYCOPYRROLATE 0.2 MG/ML IJ SOLN
INTRAMUSCULAR | Status: DC | PRN
  Administered 2017-03-14: 0.1 mg via INTRAVENOUS

## 2017-03-14 MED ORDER — DEXAMETHASONE SODIUM PHOSPHATE 10 MG/ML IJ SOLN
INTRAMUSCULAR | Status: AC
  Filled 2017-03-14: qty 1

## 2017-03-14 MED ORDER — HYDROCODONE-ACETAMINOPHEN 5-325 MG PO TABS: 1.0000 | ORAL_TABLET | ORAL | 0 refills | Status: DC | PRN

## 2017-03-14 MED ORDER — FENTANYL CITRATE (PF) 100 MCG/2ML IJ SOLN: 25.0000 ug | INTRAMUSCULAR | Status: DC | PRN

## 2017-03-14 MED ORDER — FENTANYL CITRATE (PF) 100 MCG/2ML IJ SOLN
INTRAMUSCULAR | Status: AC
  Filled 2017-03-14: qty 2

## 2017-03-14 MED ORDER — ONDANSETRON HCL 4 MG/2ML IJ SOLN
INTRAMUSCULAR | Status: AC
  Filled 2017-03-14: qty 2

## 2017-03-14 MED ORDER — CEFAZOLIN SODIUM-DEXTROSE 2-4 GM/100ML-% IV SOLN
INTRAVENOUS | Status: AC
  Filled 2017-03-14: qty 100

## 2017-03-14 MED ORDER — LACTATED RINGERS IV SOLN
INTRAVENOUS | Status: DC
  Administered 2017-03-14 (×2): via INTRAVENOUS

## 2017-03-14 MED ORDER — ONDANSETRON HCL 4 MG/2ML IJ SOLN
INTRAMUSCULAR | Status: DC | PRN
  Administered 2017-03-14: 4 mg via INTRAVENOUS

## 2017-03-14 MED ORDER — SEVOFLURANE IN SOLN
RESPIRATORY_TRACT | Status: AC
  Filled 2017-03-14: qty 250

## 2017-03-14 MED ORDER — FAMOTIDINE 20 MG PO TABS
ORAL_TABLET | ORAL | Status: AC
  Filled 2017-03-14: qty 1

## 2017-03-14 MED ORDER — GLYCOPYRROLATE 0.2 MG/ML IJ SOLN
INTRAMUSCULAR | Status: AC
  Filled 2017-03-14: qty 1

## 2017-03-14 MED ORDER — LIDOCAINE HCL (PF) 2 % IJ SOLN
INTRAMUSCULAR | Status: AC
  Filled 2017-03-14: qty 2

## 2017-03-14 MED ORDER — FAMOTIDINE 20 MG PO TABS
20.0000 mg | ORAL_TABLET | Freq: Once | ORAL | Status: AC
  Administered 2017-03-14: 20 mg via ORAL

## 2017-03-14 MED ORDER — PROPOFOL 10 MG/ML IV BOLUS
INTRAVENOUS | Status: AC
  Filled 2017-03-14: qty 20

## 2017-03-14 MED ORDER — LIDOCAINE HCL (CARDIAC) 20 MG/ML IV SOLN
INTRAVENOUS | Status: DC | PRN
  Administered 2017-03-14: 80 mg via INTRAVENOUS

## 2017-03-14 SURGICAL SUPPLY — 27 items
BACTOSHIELD CHG 4% 4OZ (MISCELLANEOUS) ×1
BAG DRAIN CYSTO-URO LG1000N (MISCELLANEOUS) ×2 IMPLANT
BAG URO DRAIN 2000ML W/SPOUT (MISCELLANEOUS) IMPLANT
CATH FOL LEG HOLDER (MISCELLANEOUS) IMPLANT
CATH FOLEY 2WAY  5CC 16FR (CATHETERS)
CATH FOLEY 2WAY 5CC 16FR (CATHETERS)
CATH FOLEY 3WAY 30CC 24FR (CATHETERS)
CATH URTH 16FR FL 2W BLN LF (CATHETERS) IMPLANT
CATH URTH STD 24FR FL 3W 2 (CATHETERS) IMPLANT
ELECT LOOP 22F BIPOLAR SML (ELECTROSURGICAL) ×2
ELECT REM PT RETURN 9FT ADLT (ELECTROSURGICAL) ×2
ELECTRODE LOOP 22F BIPOLAR SML (ELECTROSURGICAL) ×1 IMPLANT
ELECTRODE REM PT RTRN 9FT ADLT (ELECTROSURGICAL) ×1 IMPLANT
EVACUATOR ELLICK (MISCELLANEOUS) ×2 IMPLANT
GLOVE BIO SURGEON STRL SZ7.5 (GLOVE) ×2 IMPLANT
GOWN STRL REUS W/ TWL LRG LVL3 (GOWN DISPOSABLE) ×2 IMPLANT
GOWN STRL REUS W/TWL LRG LVL3 (GOWN DISPOSABLE) ×4
KIT RM TURNOVER CYSTO AR (KITS) ×2 IMPLANT
LOOP CUT BIPOLAR 24F LRG (ELECTROSURGICAL) IMPLANT
PACK CYSTO AR (MISCELLANEOUS) ×2 IMPLANT
SCRUB CHG 4% DYNA-HEX 4OZ (MISCELLANEOUS) ×1 IMPLANT
SET IRRIG Y TYPE TUR BLADDER L (SET/KITS/TRAYS/PACK) ×2 IMPLANT
SOL .9 NS 3000ML IRR  AL (IV SOLUTION) ×2
SOL .9 NS 3000ML IRR AL (IV SOLUTION) ×2
SOL .9 NS 3000ML IRR UROMATIC (IV SOLUTION) ×2 IMPLANT
SURGILUBE 2OZ TUBE FLIPTOP (MISCELLANEOUS) ×2 IMPLANT
WATER STERILE IRR 1000ML POUR (IV SOLUTION) ×2 IMPLANT

## 2017-03-14 NOTE — Anesthesia Post-op Follow-up Note (Cosign Needed)
Anesthesia QCDR form completed.        

## 2017-03-14 NOTE — H&P (Signed)
03/14/2017 10:03 AM   Kim Espinoza [birth year] [id number]  CC: History of bladder cancer, bladder tumor  HPI: The patient presents today for post induction BCG TURBT for possible recurrence of bladder cancer. She had a 2 cm papular tumor on the right lateral wall with adjacent 1 cm satellite tumor near the bladder neck. These were found during hematuria workup that was otherwise negative. 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 It was indeterminate if there was muscularispropria in the specimen.   She underwent repeat TURBT due to her stage. There was muscle in the specimen however there was no residual carcinoma.  She completed induction BCG in April 2018.  First post-induction cystoscopy showed: A necrotic lesion on the right lateral wall in apparent TURBT scar. She also has an erythematous 2-3 cm lesion on the left upper lateral wall concerning for a new neoplasm. There are various erythematous patches at the bladder that are possibly CIS.   PMH: Past Medical History:  Diagnosis Date  . Abnormal mammogram 2011, 2012   Right side.   . Anxiety   . Breast screening, unspecified   . Cancer ([place])    "bladder stage 1"  . Hypercholesterolemia since 2001  . Hypertension    since approx. 2001  . Mammographic microcalcification   . Personal history of tobacco use, presenting hazards to health   . Special screening for malignant neoplasms, colon   . 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 BREAST BIOPSY Right ??   x2 - core w/clip - neg  . BREAST BIOPSY-right  2011   Stereotactic - Dr. Bary Castilla- Benign breast tissue containing stromal calcifications wiithin.  Periductal Fibrosis.  Marland Kitchen BREAST SURGERY Right 2011, 2013   stero biopsy  . CAROTID ENDARTERECTOMY  2004  . CATARACT EXTRACTION EXTRACAPSULAR  2013   Left eye  . COLONOSCOPY  2012   Dr. Bary Castilla  . EYE SURGERY Left    cataract   . TONSILLECTOMY    . TRANSURETHRAL RESECTION OF BLADDER TUMOR N/A 08/30/2016   Procedure: TRANSURETHRAL RESECTION OF BLADDER TUMOR (TURBT) SMALL;  Surgeon: [name], MD;  Location: [facility] ORS;  Service: Urology;  Laterality: N/A;  . TRANSURETHRAL RESECTION OF BLADDER TUMOR WITH MITOMYCIN-C N/A 07/11/2016   Procedure: TRANSURETHRAL RESECTION OF BLADDER TUMOR WITH MITOMYCIN-C;  Surgeon: [name], MD;  Location: [facility] ORS;  Service: Urology;  Laterality: N/A;  . TUBAL LIGATION    . VASCULAR SURGERY Left 2004   carotid endarderectomy    Allergies: No Known Allergies  Family History: Family History  Problem Relation Age of Onset  . Cancer Father        lung cancer - deceased  . Cancer Sister 65       breast cancer - alive and well  . Breast cancer Sister 5  . Breast cancer Paternal Aunt   . Colon cancer Neg Hx   . Ovarian cancer Neg Hx   . Hematuria Neg Hx   . Prostate cancer Neg Hx   . Bladder Cancer Neg Hx     Social History:  reports that she quit smoking about 10 years ago. She has never used smokeless tobacco. She reports that she does not drink alcohol or use drugs.  ROS: 12 point ROS negative except for above  Physical Exam: There were no vitals taken for this visit.  Constitutional:  Alert and oriented, No acute distress. HEENT: [place] AT,  moist mucus membranes.  Trachea midline, no masses. Cardiovascular: No clubbing, cyanosis, or edema. RRR Respiratory: Normal respiratory effort, no increased work of breathing. Lungs clear GI: Abdomen is soft, nontender, nondistended, no abdominal masses GU: No CVA tenderness.  Skin: No rashes, bruises or suspicious lesions. Lymph: No cervical or inguinal adenopathy. Neurologic: Grossly intact, no focal deficits, moving all 4 extremities. Psychiatric: Normal mood and affect.  Laboratory Data: Lab Results  Component Value Date   WBC 8.0 03/07/2017   HGB 14.0 03/07/2017   HCT 41.5 03/07/2017   MCV 90.5  03/07/2017   PLT 214 03/07/2017    Lab Results  Component Value Date   CREATININE 0.65 03/07/2017    No results found for: PSA  No results found for: TESTOSTERONE  No results found for: HGBA1C  Urinalysis    Component Value Date/Time   COLORURINE RED (A) 06/14/2016 1535   APPEARANCEUR Clear 03/01/2017 1425   LABSPEC 1.001 (L) 06/14/2016 1535   PHURINE 7.0 06/14/2016 1535   GLUCOSEU Negative 03/01/2017 1425   HGBUR 3+ (A) 06/14/2016 1535   BILIRUBINUR Negative 03/01/2017 [place] 06/14/2016 1535   PROTEINUR Negative 03/01/2017 1425   PROTEINUR 100 (A) 06/14/2016 1535   NITRITE Negative 03/01/2017 1425   NITRITE NEGATIVE 06/14/2016 1535   LEUKOCYTESUR 1+ (A) 03/01/2017 1425    Assessment & Plan:    1. Bladder cancer I discussed the patient that her cystoscopy findings today are concerning for post induction BCG recurrence in for cystoscopy. I discussed that the next step would be repeat TURBT of these lesions. We did discuss future treatment recommendations would be pending the results of repeat TURBT. All questions were answered. The patient is agreeable to proceeding with TURBT which she has been through this two times in the past.  [name], [facility] [address] [place], [place] [address] [phone]

## 2017-03-14 NOTE — Transfer of Care (Signed)
Immediate Anesthesia Transfer of Care Note  Patient: Kim Espinoza  Procedure(s) Performed: Procedure(s): TRANSURETHRAL RESECTION OF BLADDER TUMOR (TURBT)-MEDIUM (N/A)  Patient Location: PACU  Anesthesia Type:General  Level of Consciousness: drowsy and patient cooperative  Airway & Oxygen Therapy: Patient Spontanous Breathing and Patient connected to face mask oxygen  Post-op Assessment: Report given to RN and Post -op Vital signs reviewed and stable  Post vital signs: Reviewed and stable  Last Vitals:  Vitals:   03/14/17 1020  BP: (!) 161/70  Pulse: 67  Resp: 16  Temp: (!) 36.1 C    Last Pain:  Vitals:   03/14/17 1020  TempSrc: Oral      Patients Stated Pain Goal: 0 (03/47/42 5956)  Complications: No apparent anesthesia complications

## 2017-03-14 NOTE — Anesthesia Procedure Notes (Signed)
Procedure Name: LMA Insertion Date/Time: 03/14/2017 11:46 AM Performed by: Silvana Newness Pre-anesthesia Checklist: Patient identified, Emergency Drugs available, Suction available, Patient being monitored and Timeout performed Patient Re-evaluated:Patient Re-evaluated prior to induction Oxygen Delivery Method: Circle system utilized Preoxygenation: Pre-oxygenation with 100% oxygen Induction Type: IV induction Ventilation: Mask ventilation without difficulty LMA: LMA inserted LMA Size: 4.0 Number of attempts: 1 Placement Confirmation: positive ETCO2 and breath sounds checked- equal and bilateral Tube secured with: Tape Dental Injury: Teeth and Oropharynx as per pre-operative assessment

## 2017-03-14 NOTE — Anesthesia Preprocedure Evaluation (Signed)
Anesthesia Evaluation  Patient identified by MRN, date of birth, ID band Patient awake    Reviewed: Allergy & Precautions, NPO status , Patient's Chart, lab work & pertinent test results, reviewed documented beta blocker date and time   History of Anesthesia Complications Negative for: history of anesthetic complications  Airway Mallampati: II  TM Distance: >3 FB     Dental  (+) Chipped   Pulmonary former smoker,    Pulmonary exam normal        Cardiovascular Exercise Tolerance: Good hypertension, Pt. on medications and Pt. on home beta blockers + Peripheral Vascular Disease  Normal cardiovascular exam     Neuro/Psych    GI/Hepatic negative GI ROS, Neg liver ROS,   Endo/Other  negative endocrine ROS  Renal/GU negative Renal ROS Bladder dysfunction      Musculoskeletal  (+) Fibromyalgia -  Abdominal Normal abdominal exam  (+)   Peds  Hematology negative hematology ROS (+)   Anesthesia Other Findings Hypertensive this am. EKG reviewed and ok.  Past Medical History: 2011, 2012: Abnormal mammogram     Comment:  Right side.  No date: Anxiety No date: Breast screening, unspecified No date: Cancer [facility])     Comment:  "bladder stage 1" since 2001: Hypercholesterolemia No date: Hypertension     Comment:  since approx. 2001 No date: Mammographic microcalcification No date: Personal history of tobacco use, presenting hazards to health No date: Special screening for malignant neoplasms, colon sine April 2013: Vertigo   Reproductive/Obstetrics negative OB ROS                             Anesthesia Physical  Anesthesia Plan  ASA: III  Anesthesia Plan: General   Post-op Pain Management:    Induction: Intravenous  PONV Risk Score and Plan: 3 and Ondansetron and Dexamethasone  Airway Management Planned: LMA  Additional Equipment:   Intra-op Plan:   Post-operative Plan:  Extubation in OR  Informed Consent: I have reviewed the patients History and Physical, chart, labs and discussed the procedure including the risks, benefits and alternatives for the proposed anesthesia with the patient or authorized representative who has indicated his/her understanding and acceptance.     Plan Discussed with: CRNA and Surgeon  Anesthesia Plan Comments:         Anesthesia Quick Evaluation

## 2017-03-14 NOTE — Op Note (Signed)
Date of procedure: 03/14/17  Preoperative diagnosis:  1. Bladder tumor 2. History of bladder cancer   Postoperative diagnosis:  1. Same   Procedure: 1. Transurethral resection of bladder tumor (largest 3 cm tumor with superficial resection of three suspicious areas)  Surgeon: Baruch Gouty, MD  Anesthesia: General  Complications: None  Intraoperative findings: The patient had an approximately 3 cm tumor/necrotic area at the site of her previous resection on the right lateral wall that was resected down to muscle. She had 3 erythematous areas that were superficially resected to rule out CIS. These areas were located in the right posterior lateral wall. The anterior wall of the bladder in the left lateral wall.  EBL: None  Specimens: Bladder tumor as well as superior fascial resection specimen sent together as one specimen.  Drains: None  Disposition: Stable to the postanesthesia care unit  Indication for procedure: The patient is a 76 y.o. female with history of pT1 bladder cancer who was found to have possible recurrence on the first post BCG induction cystoscopy presents today for definitive management.  After reviewing the management options for treatment, the patient elected to proceed with the above surgical procedure(s). We have discussed the potential benefits and risks of the procedure, side effects of the proposed treatment, the likelihood of the patient achieving the goals of the procedure, and any potential problems that might occur during the procedure or recuperation. Informed consent has been obtained.  Description of procedure: The patient was met in the preoperative area. All risks, benefits, and indications of the procedure were described in great detail. The patient consented to the procedure. Preoperative antibiotics were given. The patient was taken to the operative theater. General anesthesia was induced per the anesthesia service. The patient was then placed in the  dorsal lithotomy position and prepped and draped in the usual sterile fashion. A preoperative timeout was called.   A 24 French 30 resectoscope with visual obturator was inserted and the patient's bladder per urethra atraumatically. Pan cystoscopy revealed the known necrotic area on the right lateral wall that was approximately 3 cm at the site of previous resection. This was resected down to muscle. She also had 3 erythematous patches located at the right lateral wall, anterior bladder, left lateral wall. These were superficially resected to rule out CIS. Specimen was then sent as bladder tumor to pathology. Hemostasis was then obtained and was excellent. No further tumors or irregularities were seen. The patient's bladder was then drained this point. The patient was then woken from anesthesia and transferred stable condition post care unit.  Plan: The patient will follow-up in one week to discuss pathology results which will determine the next step in her treatment algorithm.  Baruch Gouty, M.D.

## 2017-03-14 NOTE — Anesthesia Postprocedure Evaluation (Signed)
Anesthesia Post Note  Patient: Kim Espinoza  Procedure(s) Performed: Procedure(s) (LRB): TRANSURETHRAL RESECTION OF BLADDER TUMOR (TURBT)-MEDIUM (N/A)  Patient location during evaluation: PACU Anesthesia Type: General Level of consciousness: awake and alert Pain management: pain level controlled Vital Signs Assessment: post-procedure vital signs reviewed and stable Respiratory status: spontaneous breathing, nonlabored ventilation, respiratory function stable and patient connected to nasal cannula oxygen Cardiovascular status: blood pressure returned to baseline and stable Postop Assessment: no signs of nausea or vomiting Anesthetic complications: no     Last Vitals:  Vitals:   03/14/17 1259 03/14/17 1331  BP: 95/80 (!) 149/69  Pulse: 62 63  Resp: 16   Temp: (!) 35.9 C     Last Pain:  Vitals:   03/14/17 1259  TempSrc: Temporal  PainSc:                  Martha Clan

## 2017-03-16 LAB — SURGICAL PATHOLOGY

## 2017-03-22 ENCOUNTER — Encounter: Payer: Self-pay | Admitting: Urology

## 2017-03-22 ENCOUNTER — Ambulatory Visit: Payer: Medicare Other | Admitting: Urology

## 2017-03-22 VITALS — BP 133/74 | HR 78 | Ht 62.0 in | Wt 145.5 lb

## 2017-03-22 DIAGNOSIS — C678 Malignant neoplasm of overlapping sites of bladder: Secondary | ICD-10-CM

## 2017-03-22 LAB — MICROSCOPIC EXAMINATION: BACTERIA UA: NONE SEEN

## 2017-03-22 LAB — URINALYSIS, COMPLETE
Bilirubin, UA: NEGATIVE
GLUCOSE, UA: NEGATIVE
Ketones, UA: NEGATIVE
NITRITE UA: NEGATIVE
PH UA: 5.5 (ref 5.0–7.5)
Specific Gravity, UA: 1.015 (ref 1.005–1.030)
Urobilinogen, Ur: 0.2 mg/dL (ref 0.2–1.0)

## 2017-03-22 NOTE — Progress Notes (Signed)
03/22/2017 2:10 PM   [place] Score [birth year] [id number]  Referring provider: [name], MD [address] [facility] [place], [place] [address]  Chief Complaint  Patient presents with  . Routine Post Op    HPI: The patient presents today for post induction BCG TURBT for possible recurrence of bladder cancer. She had a 2 cm papular tumor on the right lateral wall with adjacent 1 cm satellite tumor near the bladder neck. These were found during hematuria workup that was otherwise negative. 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 It was indeterminate if there was muscularispropria in the specimen.   She underwent repeat TURBT due to her stage. There was muscle in the specimen however there was no residual carcinoma.  She completed induction BCG in April 2018.  First post-induction cystoscopy showed: A necrotic lesion on the right lateral wall in apparent TURBT scar. She also has an erythematous 2-3 cm lesion on the left upper lateral wall concerning for a new neoplasm. There are various erythematous patches at the bladder that are possibly CIS.  However repeat TUR of this area was benign in July 2018.   PMH: Past Medical History:  Diagnosis Date  . Abnormal mammogram 2011, 2012   Right side.   . Anxiety   . Breast screening, unspecified   . Cancer ([place])    "bladder stage 1"  . Hypercholesterolemia since 2001  . Hypertension    since approx. 2001  . Mammographic microcalcification   . Personal history of tobacco use, presenting hazards to health   . Special screening for malignant neoplasms, colon   . 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 BREAST BIOPSY Right ??   x2 - core w/clip - neg  . BREAST BIOPSY-right  2011   Stereotactic - Dr. Bary Castilla- Benign breast tissue containing stromal calcifications wiithin.  Periductal Fibrosis.  Marland Kitchen BREAST SURGERY Right  2011, 2013   stero biopsy  . CAROTID ENDARTERECTOMY  2004  . CATARACT EXTRACTION EXTRACAPSULAR  2013   Left eye  . COLONOSCOPY  2012   Dr. Bary Castilla  . EYE SURGERY Left    cataract   . TONSILLECTOMY    . TRANSURETHRAL RESECTION OF BLADDER TUMOR N/A 08/30/2016   Procedure: TRANSURETHRAL RESECTION OF BLADDER TUMOR (TURBT) SMALL;  Surgeon: [name], MD;  Location: [facility] ORS;  Service: Urology;  Laterality: N/A;  . TRANSURETHRAL RESECTION OF BLADDER TUMOR N/A 03/14/2017   Procedure: TRANSURETHRAL RESECTION OF BLADDER TUMOR (TURBT)-MEDIUM;  Surgeon: [name], MD;  Location: [facility] ORS;  Service: Urology;  Laterality: N/A;  . TRANSURETHRAL RESECTION OF BLADDER TUMOR WITH MITOMYCIN-C N/A 07/11/2016   Procedure: TRANSURETHRAL RESECTION OF BLADDER TUMOR WITH MITOMYCIN-C;  Surgeon: [name], MD;  Location: [facility] ORS;  Service: Urology;  Laterality: N/A;  . TUBAL LIGATION    . VASCULAR SURGERY Left 2004   carotid endarderectomy    Home Medications:  Allergies as of 03/22/2017   No Known Allergies     Medication List       Accurate as of 03/22/17  2:10 PM. Always use your most recent med list.          ALPRAZolam 0.25 MG tablet Commonly known as:  XANAX Take 0.25 mg by mouth daily as needed for anxiety.   amLODipine 5 MG tablet Commonly known as:  NORVASC Take 5 mg by mouth daily.   aspirin EC 81 MG  tablet Take 81 mg by mouth every evening.   atenolol 50 MG tablet Commonly known as:  TENORMIN Take 50 mg by mouth 2 (two) times daily.   B-complex with vitamin C tablet Take 1 tablet by mouth every evening.   BCG vaccine 81 MG/VIAL injection Commonly known as:  THERACYS by Intravesical route.   cephALEXin 500 MG capsule Commonly known as:  KEFLEX Take 1 capsule (500 mg total) by mouth 3 (three) times daily.   Flaxseed Oil 1000 MG Caps Take 1,000 mg by mouth every evening.   FOLIC ACID PO Take 1 tablet by mouth daily.   HYDROcodone-acetaminophen 5-325  MG tablet Commonly known as:  NORCO Take 1-2 tablets by mouth every 4 (four) hours as needed for moderate pain.   simvastatin 10 MG tablet Commonly known as:  ZOCOR Take 10 mg by mouth daily.   vitamin B-12 1000 MCG tablet Commonly known as:  CYANOCOBALAMIN Take 1,000 mcg by mouth every evening.   vitamin B-6 250 MG tablet Take 250 mg by mouth every evening.       Allergies: No Known Allergies  Family History: Family History  Problem Relation Age of Onset  . Cancer Father        lung cancer - deceased  . Cancer Sister 38       breast cancer - alive and well  . Breast cancer Sister 73  . Breast cancer Paternal Aunt   . Colon cancer Neg Hx   . Ovarian cancer Neg Hx   . Hematuria Neg Hx   . Prostate cancer Neg Hx   . Bladder Cancer Neg Hx     Social History:  reports that she quit smoking about 10 years ago. She has never used smokeless tobacco. She reports that she does not drink alcohol or use drugs.  ROS: UROLOGY Frequent Urination?: No Hard to postpone urination?: No Burning/pain with urination?: No Get up at night to urinate?: No Leakage of urine?: No Urine stream starts and stops?: No Trouble starting stream?: No Do you have to strain to urinate?: No Blood in urine?: No Urinary tract infection?: No Sexually transmitted disease?: No Injury to kidneys or bladder?: No Painful intercourse?: No Weak stream?: No Currently pregnant?: No Vaginal bleeding?: No Last menstrual period?: n  Gastrointestinal Nausea?: No Vomiting?: No Indigestion/heartburn?: No Diarrhea?: No Constipation?: No  Constitutional Fever: No Night sweats?: No Weight loss?: No Fatigue?: No  Skin Skin rash/lesions?: No Itching?: No  Eyes Blurred vision?: No Double vision?: No  Ears/Nose/Throat Sore throat?: No Sinus problems?: No  Hematologic/Lymphatic Swollen glands?: No Easy bruising?: No  Cardiovascular Leg swelling?: No Chest pain?: No  Respiratory Cough?:  No Shortness of breath?: No  Endocrine Excessive thirst?: No  Musculoskeletal Back pain?: No Joint pain?: No  Neurological Headaches?: No Dizziness?: No  Psychologic Depression?: No Anxiety?: No  Physical Exam: BP 133/74 (BP Location: Left Arm, Patient Position: Sitting, Cuff Size: Normal)   Pulse 78   Ht 5\' 2"  (1.575 m)   Wt 145 lb 8 oz (66 kg)   BMI 26.61 kg/m   Constitutional:  Alert and oriented, No acute distress. HEENT: [place] AT, moist mucus membranes.  Trachea midline, no masses. Cardiovascular: No clubbing, cyanosis, or edema. Respiratory: Normal respiratory effort, no increased work of breathing. GI: Abdomen is soft, nontender, nondistended, no abdominal masses GU: No CVA tenderness.  Skin: No rashes, bruises or suspicious lesions. Lymph: No cervical or inguinal adenopathy. Neurologic: Grossly intact, no focal deficits, moving all 4  extremities. Psychiatric: Normal mood and affect.  Laboratory Data: Lab Results  Component Value Date   WBC 8.0 03/07/2017   HGB 14.0 03/07/2017   HCT 41.5 03/07/2017   MCV 90.5 03/07/2017   PLT 214 03/07/2017    Lab Results  Component Value Date   CREATININE 0.65 03/07/2017    No results found for: PSA  No results found for: TESTOSTERONE  No results found for: HGBA1C  Urinalysis    Component Value Date/Time   COLORURINE RED (A) 06/14/2016 1535   APPEARANCEUR Clear 03/01/2017 1425   LABSPEC 1.001 (L) 06/14/2016 1535   PHURINE 7.0 06/14/2016 1535   GLUCOSEU Negative 03/01/2017 1425   HGBUR 3+ (A) 06/14/2016 1535   BILIRUBINUR Negative 03/01/2017 [place] 06/14/2016 1535   PROTEINUR Negative 03/01/2017 1425   PROTEINUR 100 (A) 06/14/2016 1535   NITRITE Negative 03/01/2017 1425   NITRITE NEGATIVE 06/14/2016 1535   LEUKOCYTESUR 1+ (A) 03/01/2017 1425     Assessment & Plan:    1. 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 She'll need maintenance BCG at that time. She'll need 3 years total of maintenance BCG.   Return in about 3 months (around 06/22/2017) for cystoscopy.  [name], MD  [facility] [address] [place], [place] [address] [phone]

## 2017-06-21 ENCOUNTER — Encounter: Payer: Self-pay | Admitting: Urology

## 2017-06-21 ENCOUNTER — Ambulatory Visit (INDEPENDENT_AMBULATORY_CARE_PROVIDER_SITE_OTHER): Payer: Medicare Other | Admitting: Urology

## 2017-06-21 VITALS — BP 176/81 | HR 78 | Ht 62.0 in | Wt 149.6 lb

## 2017-06-21 DIAGNOSIS — C678 Malignant neoplasm of overlapping sites of bladder: Secondary | ICD-10-CM | POA: Diagnosis not present

## 2017-06-21 LAB — URINALYSIS, COMPLETE
Bilirubin, UA: NEGATIVE
Glucose, UA: NEGATIVE
Ketones, UA: NEGATIVE
Nitrite, UA: NEGATIVE
PH UA: 5.5 (ref 5.0–7.5)
PROTEIN UA: NEGATIVE
Specific Gravity, UA: <1.005 — ABNORMAL LOW (ref 1.005–1.030)
UUROB: 0.2 mg/dL (ref 0.2–1.0)

## 2017-06-21 LAB — MICROSCOPIC EXAMINATION
Bacteria, UA: NONE SEEN
RBC, UA: NONE SEEN /hpf (ref 0–?)

## 2017-06-21 MED ORDER — CIPROFLOXACIN HCL 500 MG PO TABS
500.0000 mg | ORAL_TABLET | Freq: Once | ORAL | Status: AC
  Administered 2017-06-21: 500 mg via ORAL

## 2017-06-21 MED ORDER — LIDOCAINE HCL 2 % EX GEL
1.0000 "application " | Freq: Once | CUTANEOUS | Status: AC
  Administered 2017-06-21: 1 via URETHRAL

## 2017-06-21 NOTE — Progress Notes (Signed)
   06/21/17  CC: No chief complaint on file.   HPI: The patient presents today for surveillance cystoscopy for bladder cancer. In November 2017, she had a 2 cm tumor on the right lateral wall with adjacent 1 cm satellite tumor near the bladder neck. These were found during hematuria workup that was otherwise negative. Her bladder tumor washigh-grade pT1 papillary inverted urothelial carcinoma. It was indeterminate if there was muscularispropria in the specimen.   She underwent repeat TURBT due to her stage. There was muscle in the specimen however there was no residual carcinoma.  She completed induction BCG in April 2018.  First post-induction cystoscopy showed: Anecrotic lesion on the right lateral wall in apparent TURBT scar. She also has an erythematous 2-3 cm lesion on the left upper lateral wall concerning for a new neoplasm. There are various erythematous patches at the bladder that are possibly CIS.  However repeat TUR of this area was benign in July 2018.  There were no vitals taken for this visit. NED. A&Ox3.   No respiratory distress   Abd soft, NT, ND Normal external genitalia with patent urethral meatus  Cystoscopy Procedure Note  Patient identification was confirmed, informed consent was obtained, and patient was prepped using Betadine solution.  Lidocaine jelly was administered per urethral meatus.    Preoperative abx where received prior to procedure.    Procedure: - Flexible cystoscope introduced, without any difficulty.   - Thorough search of the bladder revealed:    normal urethral meatus    normal urothelium    no stones    no ulcers     no tumors. Previous TUR sites noted on left and right lateral wall. Similar in appearance from time of negative TURBT in July 2018.    no urethral polyps    no trabeculation  - Ureteral orifices were normal in position and appearance.  Post-Procedure: - Patient tolerated the procedure well  Assessment/ Plan:  1.  High risk non muscle invasive bladder cancer Stable cystoscopy today.  We will send urine for cytology.  Plan for her first course of maintenance BCG which she will need 3 years of.  She will follow-up 3 months after completion of maintenance BCG for repeat cystoscopy and cytology.  [name], MD

## 2017-06-25 NOTE — Progress Notes (Signed)
06/26/2017 10:50 AM   Kim Espinoza [birth year] [id number]  Referring provider: [name], MD [address] [facility] [place], [place] [address]  Chief Complaint  Patient presents with  . Bladder Cancer    HPI: 76 yo WF who presents today to start her maintenance BCG.  This will be the first of 3.    Oncological history  Patient was found to have 2 small tumors on the right lateral wall just lateral to the ureteral orifice. Each tumor was approximately 1 cm in size, on a narrow stalk, low-grade appearing during a cystoscopy performed on 06/27/2016 with Dr. Louis Meckel.   She underwent TURBT on 07/11/2016 with Dr. Erlene Quan.  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 It was indeterminate if there was muscularis propria in the specimen.  Repeated TURBT was performed on 08/30/2016 with Dr. Pilar Jarvis.  Pathology was negative for residual carcinoma and muscularis propria was present. She completed a 6 weeks induction course of BCG on 12/07/2016.   She underwent a surveillance cystoscopy with Dr. Pilar Jarvis on 03/01/2017 and a necrotic lesion on the right lateral wall apparent TURBT scar. She also has an erythematous 2-3 cm lesion on the left upper lateral wall concerning for a new neoplasm. There are various erythematous patches at the bladder that are possibly CIS. TURBT was completed on 03/14/2017 with Dr. Pilar Jarvis.  Pathology was benign.   Surveillance cystoscopy completed on 06/21/2017 with Dr. Pilar Jarvis noted Previous TUR sites noted on left and right lateral wall. Similar in appearance from time of negative TURBT in July 2018.  Cytology is pending.    Today, she is not having urinary complaints.  She specifically denies dysuria, gross hematuria and suprapubic pain.  She has not had fevers, chills, nausea and vomiting.  She does not have a new cough.  UA today is negative.    PMH: Past Medical History:  Diagnosis Date  . Abnormal mammogram 2011, 2012   Right side.   . Anxiety   . Breast screening, unspecified   . Cancer ([place])    "bladder stage 1"  . Hypercholesterolemia since 2001  . Hypertension    since approx. 2001  . Mammographic microcalcification   . Personal history of tobacco use, presenting hazards to health   . Special screening for malignant neoplasms, colon   . 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 BREAST BIOPSY Right ??   x2 - core w/clip - neg  . BREAST BIOPSY-right  2011   Stereotactic - Dr. Bary Castilla- Benign breast tissue containing stromal calcifications wiithin.  Periductal Fibrosis.  Marland Kitchen BREAST SURGERY Right 2011, 2013   stero biopsy  . CAROTID ENDARTERECTOMY  2004  . CATARACT EXTRACTION EXTRACAPSULAR  2013   Left eye  . COLONOSCOPY  2012   Dr. Bary Castilla  . EYE SURGERY Left    cataract   . TONSILLECTOMY    . TUBAL LIGATION    . VASCULAR SURGERY Left 2004   carotid endarderectomy    Home Medications:  Allergies as of 06/26/2017   No Known Allergies     Medication List        Accurate as of 06/26/17 10:50 AM. Always use your most recent med list.          ALPRAZolam 0.25 MG tablet Commonly known as:  XANAX Take 0.25 mg by mouth daily as needed for anxiety.   amLODipine 5 MG tablet Commonly known as:  NORVASC Take 5 mg by mouth daily.   aspirin EC 81 MG tablet Take 81 mg by mouth every evening.   atenolol 50 MG tablet Commonly known as:  TENORMIN Take 50 mg by mouth 2 (two) times daily.   B-complex with vitamin C tablet Take 1 tablet by mouth every evening.   BCG vaccine 81 MG/VIAL injection Commonly known as:  THERACYS by Intravesical route.   cephALEXin 500 MG capsule Commonly known as:  KEFLEX Take 1 capsule (500 mg total) by mouth 3 (three) times daily.   Flaxseed Oil 1000 MG Caps Take 1,000 mg by mouth every evening.   FOLIC ACID PO Take 1 tablet by mouth daily.   HYDROcodone-acetaminophen 5-325 MG  tablet Commonly known as:  NORCO Take 1-2 tablets by mouth every 4 (four) hours as needed for moderate pain.   simvastatin 10 MG tablet Commonly known as:  ZOCOR Take 10 mg by mouth daily.   vitamin B-12 1000 MCG tablet Commonly known as:  CYANOCOBALAMIN Take 1,000 mcg by mouth every evening.   vitamin B-6 250 MG tablet Take 250 mg by mouth every evening.       Allergies: No Known Allergies  Family History: Family History  Problem Relation Age of Onset  . Cancer Father        lung cancer - deceased  . Cancer Sister 57       breast cancer - alive and well  . Breast cancer Sister 5  . Breast cancer Paternal Aunt   . Colon cancer Neg Hx   . Ovarian cancer Neg Hx   . Hematuria Neg Hx   . Prostate cancer Neg Hx   . Bladder Cancer Neg Hx     Social History:  reports that she quit smoking about 11 years ago. she has never used smokeless tobacco. She reports that she does not drink alcohol or use drugs.  ROS: UROLOGY Frequent Urination?: No Hard to postpone urination?: No Burning/pain with urination?: No Get up at night to urinate?: No Leakage of urine?: No Urine stream starts and stops?: No Trouble starting stream?: No Do you have to strain to urinate?: No Blood in urine?: No Urinary tract infection?: No Sexually transmitted disease?: No Injury to kidneys or bladder?: No Painful intercourse?: No Weak stream?: No Currently pregnant?: No Vaginal bleeding?: No Last menstrual period?: n  Gastrointestinal Nausea?: No Vomiting?: No Indigestion/heartburn?: No Diarrhea?: No Constipation?: No  Constitutional Fever: No Night sweats?: No Weight loss?: No Fatigue?: No  Skin Skin rash/lesions?: No Itching?: No  Eyes Blurred vision?: No Double vision?: No  Ears/Nose/Throat Sore throat?: No Sinus problems?: No  Hematologic/Lymphatic Swollen glands?: No Easy bruising?: No  Cardiovascular Leg swelling?: No Chest pain?: No  Respiratory Cough?:  No Shortness of breath?: No  Endocrine Excessive thirst?: No  Musculoskeletal Back pain?: No Joint pain?: No  Neurological Headaches?: No Dizziness?: No  Psychologic Depression?: No Anxiety?: No  Physical Exam: BP 138/77 (BP Location: Right Arm, Patient Position: Sitting, Cuff Size: Normal)   Pulse 78   Ht 5\' 2"  (1.575 m)   Wt 145 lb 12.8 oz (66.1 kg)   BMI 26.67 kg/m   Constitutional: Well nourished. Alert and oriented, No acute distress. HEENT: [place] AT, moist mucus membranes. Trachea midline, no masses. Cardiovascular: No clubbing, cyanosis, or edema. Respiratory: Normal respiratory effort, no increased work of breathing. GI: Abdomen is soft, non tender, non distended, no abdominal masses. Liver and spleen not palpable.  No hernias appreciated.  Stool sample  for occult testing is not indicated.   GU: No CVA tenderness.  No bladder fullness or masses.   Skin: No rashes, bruises or suspicious lesions. Lymph: No cervical or inguinal adenopathy. Neurologic: Grossly intact, no focal deficits, moving all 4 extremities. Psychiatric: Normal mood and affect.  Laboratory Data: Lab Results  Component Value Date   WBC 8.0 03/07/2017   HGB 14.0 03/07/2017   HCT 41.5 03/07/2017   MCV 90.5 03/07/2017   PLT 214 03/07/2017    Lab Results  Component Value Date   CREATININE 0.65 03/07/2017    Urinalysis Negative.  See EPIC.  I have reviewed the labs.   Assessment & Plan:    1. 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  - Reviewed BCG treatment course, possible side effects including BCG sepsis, bladder irritation, worsening of her urinary symptoms  - Advised to contact our office or seek treatment in the ED if becomes febrile or pain/ vomiting are difficult control in order to arrange for emergent/urgent intervention   - Reviewed BCG treatment course, possible side effects including  BCG sepsis, bladder irritation, worsening of her urinary symptoms  - # 1 of 3 BCG installed today  - Patient was instructed to pour bleach down her toilet for the next 6 hours  -  Instructed to call the office if she should experience fevers greater than 102, chills/rigors, onset of a new cough, night sweats or further bladder spasms or inability to urinate   - RTC for # 2 of 3 BCG in one week  - Surveillance protocol also discussed today including cystoscopy every 3 months with maintenance BCG for at least 3 years and then spread out thereafter   - Urinalysis, Complete  - bcg vaccine injection 81 mg; Instill 3.24 mLs (81 mg total) into the bladder once.  - lidocaine (XYLOCAINE) 2 % jelly 1 application; Place 1 application into the urethra once.  Return in about 1 week (around 07/03/2017) for  #2 BCG.  These notes generated with voice recognition software. I apologize for typographical errors.  Zara Council, [facility] [address], [place], [place] [address] [phone]

## 2017-06-26 ENCOUNTER — Encounter: Payer: Self-pay | Admitting: Urology

## 2017-06-26 ENCOUNTER — Ambulatory Visit: Payer: Medicare Other | Admitting: Urology

## 2017-06-26 VITALS — BP 138/77 | HR 78 | Ht 62.0 in | Wt 145.8 lb

## 2017-06-26 DIAGNOSIS — C678 Malignant neoplasm of overlapping sites of bladder: Secondary | ICD-10-CM

## 2017-06-26 LAB — URINALYSIS, COMPLETE
BILIRUBIN UA: NEGATIVE
GLUCOSE, UA: NEGATIVE
KETONES UA: NEGATIVE
Nitrite, UA: NEGATIVE
PROTEIN UA: NEGATIVE
Specific Gravity, UA: 1.01 (ref 1.005–1.030)
UUROB: 0.2 mg/dL (ref 0.2–1.0)
pH, UA: 5 (ref 5.0–7.5)

## 2017-06-26 LAB — MICROSCOPIC EXAMINATION
BACTERIA UA: NONE SEEN
Epithelial Cells (non renal): NONE SEEN /hpf (ref 0–10)
RBC, UA: NONE SEEN /hpf (ref 0–?)

## 2017-06-26 MED ORDER — BCG LIVE 50 MG IS SUSR
3.2400 mL | Freq: Once | INTRAVESICAL | Status: AC
  Administered 2017-06-26: 81 mg via INTRAVESICAL

## 2017-06-26 NOTE — Progress Notes (Signed)
BCG Bladder Instillation  BCG # 1 maintanence  Due to Bladder Cancer patient is present today for a BCG treatment. Patient was cleaned and prepped in a sterile fashion with betadine and lidocaine 2% jelly was instilled into the urethra.  A 14FR catheter was inserted, urine return was noted 31ml, urine was light yellow in color.  16ml of reconstituted BCG was instilled into the bladder. The catheter was then removed. Patient tolerated well, no complications were noted  Preformed by: Zara Council PA-C, Elberta Leatherwood, CMA  Follow up/ Additional notes: 1 week BCG #2

## 2017-06-27 ENCOUNTER — Other Ambulatory Visit: Payer: Self-pay | Admitting: Urology

## 2017-07-02 NOTE — Progress Notes (Signed)
07/03/2017 10:58 AM   Bonnita Nasuti [name] 08-14-1941 [id number]  Referring provider: [name], MD [address] [facility] [place], [place] [address]  Chief Complaint  Patient presents with  . BCG Treatment    HPI: 76 yo WF who presents today to start her maintenance BCG.  This will be the second of 3.    Oncological history  Patient was found to have 2 small tumors on the right lateral wall just lateral to the ureteral orifice. Each tumor was approximately 1 cm in size, on a narrow stalk, low-grade appearing during a cystoscopy performed on 06/27/2016 with Dr. Louis Meckel.   She underwent TURBT on 07/11/2016 with Dr. Erlene Quan.  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 It was indeterminate if there was muscularis propria in the specimen.  Repeated TURBT was performed on 08/30/2016 with Dr. Pilar Jarvis.  Pathology was negative for residual carcinoma and muscularis propria was present. She completed a 6 weeks induction course of BCG on 12/07/2016.   She underwent a surveillance cystoscopy with Dr. Pilar Jarvis on 03/01/2017 and a necrotic lesion on the right lateral wall apparent TURBT scar. She also has an erythematous 2-3 cm lesion on the left upper lateral wall concerning for a new neoplasm. There are various erythematous patches at the bladder that are possibly CIS. TURBT was completed on 03/14/2017 with Dr. Pilar Jarvis.  Pathology was benign.   Surveillance cystoscopy completed on 06/21/2017 with Dr. Pilar Jarvis noted Previous TUR sites noted on left and right lateral wall. Similar in appearance from time of negative TURBT in July 2018.  Cytology is pending.    Today, she is not having urinary complaints.  She specifically denies dysuria, gross hematuria and suprapubic pain.  She has not had fevers, chills, nausea and vomiting.  She was able to hold the instillation for two hours.  She does not have a new cough.  UA today is negative.    PMH: Past Medical History:    Diagnosis Date  . Abnormal mammogram 2011, 2012   Right side.   . Anxiety   . Breast screening, unspecified   . Cancer ([place])    "bladder stage 1"  . Hypercholesterolemia since 2001  . Hypertension    since approx. 2001  . Mammographic microcalcification   . Personal history of tobacco use, presenting hazards to health   . Special screening for malignant neoplasms, colon   . 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 BREAST BIOPSY Right ??   x2 - core w/clip - neg  . BREAST BIOPSY-right  2011   Stereotactic - Dr. Bary Castilla- Benign breast tissue containing stromal calcifications wiithin.  Periductal Fibrosis.  Marland Kitchen BREAST SURGERY Right 2011, 2013   stero biopsy  . CAROTID ENDARTERECTOMY  2004  . CATARACT EXTRACTION EXTRACAPSULAR  2013   Left eye  . COLONOSCOPY  2012   Dr. Bary Castilla  . EYE SURGERY Left    cataract   . TONSILLECTOMY    . TUBAL LIGATION    . VASCULAR SURGERY Left 2004   carotid endarderectomy    Home Medications:  Allergies as of 07/03/2017   No Known Allergies     Medication List        Accurate as of 07/03/17 10:58 AM. Always use your most recent med list.          ALPRAZolam 0.25 MG tablet Commonly known as:  XANAX Take 0.25 mg by mouth daily  as needed for anxiety.   amLODipine 5 MG tablet Commonly known as:  NORVASC Take 5 mg by mouth daily.   aspirin EC 81 MG tablet Take 81 mg by mouth every evening.   atenolol 50 MG tablet Commonly known as:  TENORMIN Take 50 mg by mouth 2 (two) times daily.   B-complex with vitamin C tablet Take 1 tablet by mouth every evening.   BCG vaccine 81 MG/VIAL injection Commonly known as:  THERACYS by Intravesical route.   Flaxseed Oil 1000 MG Caps Take 1,000 mg by mouth every evening.   FOLIC ACID PO Take 1 tablet by mouth daily.   HYDROcodone-acetaminophen 5-325 MG tablet Commonly known as:  NORCO Take 1-2 tablets by mouth every 4 (four)  hours as needed for moderate pain.   simvastatin 10 MG tablet Commonly known as:  ZOCOR Take 10 mg by mouth daily.   vitamin B-12 1000 MCG tablet Commonly known as:  CYANOCOBALAMIN Take 1,000 mcg by mouth every evening.   vitamin B-6 250 MG tablet Take 250 mg by mouth every evening.       Allergies: No Known Allergies  Family History: Family History  Problem Relation Age of Onset  . Cancer Father        lung cancer - deceased  . Cancer Sister 38       breast cancer - alive and well  . Breast cancer Sister 3  . Breast cancer Paternal Aunt   . Colon cancer Neg Hx   . Ovarian cancer Neg Hx   . Hematuria Neg Hx   . Prostate cancer Neg Hx   . Bladder Cancer Neg Hx     Social History:  reports that she quit smoking about 11 years ago. she has never used smokeless tobacco. She reports that she does not drink alcohol or use drugs.  ROS: UROLOGY Frequent Urination?: No Hard to postpone urination?: No Burning/pain with urination?: No Get up at night to urinate?: No Leakage of urine?: No Urine stream starts and stops?: No Trouble starting stream?: No Do you have to strain to urinate?: No Blood in urine?: No Urinary tract infection?: No Sexually transmitted disease?: No Injury to kidneys or bladder?: No Painful intercourse?: No Weak stream?: No Currently pregnant?: No Vaginal bleeding?: No  Gastrointestinal Nausea?: No Vomiting?: No Indigestion/heartburn?: No Diarrhea?: No Constipation?: No  Constitutional Fever: No Night sweats?: No Weight loss?: No Fatigue?: No  Skin Skin rash/lesions?: No Itching?: No  Eyes Blurred vision?: No Double vision?: No  Ears/Nose/Throat Sore throat?: No Sinus problems?: No  Hematologic/Lymphatic Swollen glands?: No Easy bruising?: No  Cardiovascular Leg swelling?: No Chest pain?: No  Respiratory Cough?: No Shortness of breath?: No  Endocrine Excessive thirst?: No  Musculoskeletal Back pain?: No Joint  pain?: No  Neurological Headaches?: No Dizziness?: No  Psychologic Depression?: No Anxiety?: No  Physical Exam: BP (!) 163/78   Pulse 98   Ht 5\' 2"  (1.575 m)   Wt 148 lb (67.1 kg)   BMI 27.07 kg/m   Constitutional: Well nourished. Alert and oriented, No acute distress. HEENT: [place] AT, moist mucus membranes. Trachea midline, no masses. Cardiovascular: No clubbing, cyanosis, or edema. Respiratory: Normal respiratory effort, no increased work of breathing. GI: Abdomen is soft, non tender, non distended, no abdominal masses. Liver and spleen not palpable.  No hernias appreciated.  Stool sample for occult testing is not indicated.   GU: No CVA tenderness.  No bladder fullness or masses.   Skin: No rashes, bruises  or suspicious lesions. Lymph: No cervical or inguinal adenopathy. Neurologic: Grossly intact, no focal deficits, moving all 4 extremities. Psychiatric: Normal mood and affect.  Laboratory Data: Lab Results  Component Value Date   WBC 8.0 03/07/2017   HGB 14.0 03/07/2017   HCT 41.5 03/07/2017   MCV 90.5 03/07/2017   PLT 214 03/07/2017    Lab Results  Component Value Date   CREATININE 0.65 03/07/2017    Urinalysis Negative.  See EPIC.  I have reviewed the labs.   Assessment & Plan:    1. 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  - Reviewed BCG treatment course, possible side effects including BCG sepsis, bladder irritation, worsening of her urinary symptoms  - Advised to contact our office or seek treatment in the ED if becomes febrile or pain/ vomiting are difficult control in order to arrange for emergent/urgent intervention   - Reviewed BCG treatment course, possible side effects including BCG sepsis, bladder irritation, worsening of her urinary symptoms  - # 2 of 3 BCG installed today  - Patient was instructed to pour bleach down her toilet for the next 6 hours  -   Instructed to call the office if she should experience fevers greater than 102, chills/rigors, onset of a new cough, night sweats or further bladder spasms or inability to urinate   - RTC for # 3 of 3 BCG in one week  - Surveillance protocol also discussed today including cystoscopy every 3 months with maintenance BCG for at least 3 years and then spread out thereafter   - Urinalysis, Complete  - bcg vaccine injection 81 mg; Instill 3.24 mLs (81 mg total) into the bladder once.  - lidocaine (XYLOCAINE) 2 % jelly 1 application; Place 1 application into the urethra once.  Return for # 3 BCG.  These notes generated with voice recognition software. I apologize for typographical errors.  Zara Council, [facility] [address] [place], [place] [address] [phone]

## 2017-07-03 ENCOUNTER — Ambulatory Visit: Payer: Medicare Other | Admitting: Urology

## 2017-07-03 ENCOUNTER — Encounter: Payer: Self-pay | Admitting: Urology

## 2017-07-03 VITALS — BP 163/78 | HR 98 | Ht 62.0 in | Wt 148.0 lb

## 2017-07-03 DIAGNOSIS — C678 Malignant neoplasm of overlapping sites of bladder: Secondary | ICD-10-CM | POA: Diagnosis not present

## 2017-07-03 LAB — MICROSCOPIC EXAMINATION

## 2017-07-03 LAB — URINALYSIS, COMPLETE
Bilirubin, UA: NEGATIVE
Glucose, UA: NEGATIVE
Ketones, UA: NEGATIVE
Nitrite, UA: NEGATIVE
Protein, UA: NEGATIVE
Specific Gravity, UA: 1.015 (ref 1.005–1.030)
Urobilinogen, Ur: 0.2 mg/dL (ref 0.2–1.0)
pH, UA: 5 (ref 5.0–7.5)

## 2017-07-03 MED ORDER — BCG LIVE 50 MG IS SUSR
3.2400 mL | Freq: Once | INTRAVESICAL | Status: AC
  Administered 2017-07-03: 81 mg via INTRAVESICAL

## 2017-07-09 NOTE — Progress Notes (Signed)
07/10/2017 11:26 AM   Glendora Score 10/25/1940 [id number]  Referring provider: [name], MD [address] [facility] [place], [place] [address]  Chief Complaint  Patient presents with  . BCG    HPI: 76 yo WF who presents today to start her maintenance BCG.  This will be the 3 of 3.    Oncological history  Patient was found to have 2 small tumors on the right lateral wall just lateral to the ureteral orifice. Each tumor was approximately 1 cm in size, on a narrow stalk, low-grade appearing during a cystoscopy performed on 06/27/2016 with Dr. Louis Meckel.   She underwent TURBT on 07/11/2016 with Dr. Erlene Quan.  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 It was indeterminate if there was muscularis propria in the specimen.  Repeated TURBT was performed on 08/30/2016 with Dr. Pilar Jarvis.  Pathology was negative for residual carcinoma and muscularis propria was present. She completed a 6 weeks induction course of BCG on 12/07/2016.   She underwent a surveillance cystoscopy with Dr. Pilar Jarvis on 03/01/2017 and a necrotic lesion on the right lateral wall apparent TURBT scar. She also has an erythematous 2-3 cm lesion on the left upper lateral wall concerning for a new neoplasm. There are various erythematous patches at the bladder that are possibly CIS. TURBT was completed on 03/14/2017 with Dr. Pilar Jarvis.  Pathology was benign.   Surveillance cystoscopy completed on 06/21/2017 with Dr. Pilar Jarvis noted Previous TUR sites noted on left and right lateral wall. Similar in appearance from time of negative TURBT in July 2018.  Cytology is pending.    Today, she is not having urinary complaints.  She specifically denies dysuria, gross hematuria and suprapubic pain.  She has not had fevers, chills, nausea and vomiting.  She was able to hold the instillation for two hours.  She does not have a new cough.  UA today is negative.    PMH: Past Medical History:  Diagnosis Date    . Abnormal mammogram 2011, 2012   Right side.   . Anxiety   . Breast screening, unspecified   . Cancer ([place])    "bladder stage 1"  . Hypercholesterolemia since 2001  . Hypertension    since approx. 2001  . Mammographic microcalcification   . Personal history of tobacco use, presenting hazards to health   . Special screening for malignant neoplasms, colon   . 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 BREAST BIOPSY Right ??   x2 - core w/clip - neg  . BREAST BIOPSY-right  2011   Stereotactic - Dr. Bary Castilla- Benign breast tissue containing stromal calcifications wiithin.  Periductal Fibrosis.  Marland Kitchen BREAST SURGERY Right 2011, 2013   stero biopsy  . CAROTID ENDARTERECTOMY  2004  . CATARACT EXTRACTION EXTRACAPSULAR  2013   Left eye  . COLONOSCOPY  2012   Dr. Bary Castilla  . EYE SURGERY Left    cataract   . TONSILLECTOMY    . TRANSURETHRAL RESECTION OF BLADDER TUMOR (TURBT) SMALL N/A 08/30/2016   Performed by [name], MD at [facility]  . TRANSURETHRAL RESECTION OF BLADDER TUMOR (TURBT)-MEDIUM N/A 03/14/2017   Performed by [name], MD at [facility] ORS  . TRANSURETHRAL RESECTION OF BLADDER TUMOR WITH MITOMYCIN-C N/A 07/11/2016   Performed by [name], MD at [facility] ORS  . TUBAL LIGATION    . VASCULAR SURGERY Left 2004   carotid endarderectomy  Home Medications:  Allergies as of 07/10/2017   No Known Allergies     Medication List        Accurate as of 07/10/17 11:26 AM. Always use your most recent med list.          ALPRAZolam 0.25 MG tablet Commonly known as:  XANAX Take 0.25 mg by mouth daily as needed for anxiety.   amLODipine 5 MG tablet Commonly known as:  NORVASC Take 5 mg by mouth daily.   aspirin EC 81 MG tablet Take 81 mg by mouth every evening.   atenolol 50 MG tablet Commonly known as:  TENORMIN Take 50 mg by mouth 2 (two) times daily.   B-complex with vitamin C tablet Take 1  tablet by mouth every evening.   BCG vaccine 81 MG/VIAL injection Commonly known as:  THERACYS by Intravesical route.   Flaxseed Oil 1000 MG Caps Take 1,000 mg by mouth every evening.   FOLIC ACID PO Take 1 tablet by mouth daily.   HYDROcodone-acetaminophen 5-325 MG tablet Commonly known as:  NORCO Take 1-2 tablets by mouth every 4 (four) hours as needed for moderate pain.   simvastatin 10 MG tablet Commonly known as:  ZOCOR Take 10 mg by mouth daily.   vitamin B-12 1000 MCG tablet Commonly known as:  CYANOCOBALAMIN Take 1,000 mcg by mouth every evening.   vitamin B-6 250 MG tablet Take 250 mg by mouth every evening.       Allergies: No Known Allergies  Family History: Family History  Problem Relation Age of Onset  . Cancer Father        lung cancer - deceased  . Cancer Sister 42       breast cancer - alive and well  . Breast cancer Sister 30  . Breast cancer Paternal Aunt   . Colon cancer Neg Hx   . Ovarian cancer Neg Hx   . Hematuria Neg Hx   . Prostate cancer Neg Hx   . Bladder Cancer Neg Hx     Social History:  reports that she quit smoking about 11 years ago. she has never used smokeless tobacco. She reports that she does not drink alcohol or use drugs.  ROS: UROLOGY Frequent Urination?: No Hard to postpone urination?: No Burning/pain with urination?: No Get up at night to urinate?: No Leakage of urine?: No Urine stream starts and stops?: No Trouble starting stream?: No Do you have to strain to urinate?: No Blood in urine?: No Urinary tract infection?: No Sexually transmitted disease?: No Injury to kidneys or bladder?: No Painful intercourse?: No Weak stream?: No Currently pregnant?: No Vaginal bleeding?: No  Gastrointestinal Nausea?: No Vomiting?: No Indigestion/heartburn?: No Diarrhea?: No Constipation?: No  Constitutional Fever: No Night sweats?: No Weight loss?: No Fatigue?: No  Skin Skin rash/lesions?: No Itching?:  No  Eyes Blurred vision?: No Double vision?: No  Ears/Nose/Throat Sore throat?: No Sinus problems?: No  Hematologic/Lymphatic Swollen glands?: No Easy bruising?: No  Cardiovascular Leg swelling?: No Chest pain?: No  Respiratory Cough?: No Shortness of breath?: No  Endocrine Excessive thirst?: No  Musculoskeletal Back pain?: No Joint pain?: No  Neurological Headaches?: No Dizziness?: No  Psychologic Depression?: No Anxiety?: No  Physical Exam: BP (!) 181/79   Pulse 65   Ht 5\' 2"  (1.575 m)   Wt 148 lb 1.6 oz (67.2 kg)   BMI 27.09 kg/m   Constitutional: Well nourished. Alert and oriented, No acute distress. HEENT: [place] AT, moist mucus membranes. Trachea midline,  no masses. Cardiovascular: No clubbing, cyanosis, or edema. Respiratory: Normal respiratory effort, no increased work of breathing. GI: Abdomen is soft, non tender, non distended, no abdominal masses. Liver and spleen not palpable.  No hernias appreciated.  Stool sample for occult testing is not indicated.   GU: No CVA tenderness.  No bladder fullness or masses.   Skin: No rashes, bruises or suspicious lesions. Lymph: No cervical or inguinal adenopathy. Neurologic: Grossly intact, no focal deficits, moving all 4 extremities. Psychiatric: Normal mood and affect.  Laboratory Data: Lab Results  Component Value Date   WBC 8.0 03/07/2017   HGB 14.0 03/07/2017   HCT 41.5 03/07/2017   MCV 90.5 03/07/2017   PLT 214 03/07/2017    Lab Results  Component Value Date   CREATININE 0.65 03/07/2017    Urinalysis Negative.  See EPIC.  I have reviewed the labs.  Procedure BCG Bladder Instillation  BCG # 3/3  Due to Bladder Cancer patient is present today for a BCG treatment. Patient was cleaned and prepped in a sterile fashion with betadine and lidocaine 2% jelly was instilled into the urethra.  A 14 FR catheter was inserted, urine return was noted 40 ml, urine was yellow in color.  21ml of  reconstituted BCG was instilled into the bladder. The catheter was then removed. Patient tolerated well, no complications were noted  Preformed by: Zara Council, PA- C and Larey Seat, RN   Assessment & Plan:    1. 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  - Reviewed BCG treatment course, possible side effects including BCG sepsis, bladder irritation, worsening of her urinary symptoms  - Advised to contact our office or seek treatment in the ED if becomes febrile or pain/ vomiting are difficult control in order to arrange for emergent/urgent intervention   - Reviewed BCG treatment course, possible side effects including BCG sepsis, bladder irritation, worsening of her urinary symptoms  - # 3 of 3 BCG installed today  - Patient was instructed to pour bleach down her toilet for the next 6 hours  -  Instructed to call the office if she should experience fevers greater than 102, chills/rigors, onset of a new cough, night sweats or further bladder spasms or inability to urinate   - RTC for surveillance cystoscopy on 09/21/2017   - Surveillance protocol also discussed today including cystoscopy every 3 months with maintenance BCG for at least 3 years and then spread out thereafter   - Urinalysis, Complete  - bcg vaccine injection 81 mg; Instill 3.24 mLs (81 mg total) into the bladder once.  - lidocaine (XYLOCAINE) 2 % jelly 1 application; Place 1 application into the urethra once.  Return for surveillance cystoscopy on 09/21/2017.  These notes generated with voice recognition software. I apologize for typographical errors.  Zara Council, [facility] [address], [place], [place] [address] [phone]

## 2017-07-10 ENCOUNTER — Encounter: Payer: Self-pay | Admitting: Urology

## 2017-07-10 ENCOUNTER — Ambulatory Visit: Payer: Medicare Other | Admitting: Urology

## 2017-07-10 VITALS — BP 181/79 | HR 65 | Ht 62.0 in | Wt 148.1 lb

## 2017-07-10 DIAGNOSIS — C678 Malignant neoplasm of overlapping sites of bladder: Secondary | ICD-10-CM

## 2017-07-10 LAB — URINALYSIS, COMPLETE
BILIRUBIN UA: NEGATIVE
Glucose, UA: NEGATIVE
KETONES UA: NEGATIVE
Nitrite, UA: NEGATIVE
PROTEIN UA: NEGATIVE
SPEC GRAV UA: 1.01 (ref 1.005–1.030)
Urobilinogen, Ur: 0.2 mg/dL (ref 0.2–1.0)
pH, UA: 5 (ref 5.0–7.5)

## 2017-07-10 LAB — MICROSCOPIC EXAMINATION: RBC, UA: NONE SEEN /hpf (ref 0–?)

## 2017-07-10 MED ORDER — BCG LIVE 50 MG IS SUSR
3.2400 mL | Freq: Once | INTRAVESICAL | Status: AC
Start: 2017-07-10 — End: 2017-07-10
  Administered 2017-07-10: 81 mg via INTRAVESICAL

## 2017-09-21 ENCOUNTER — Encounter: Payer: Self-pay | Admitting: Urology

## 2017-09-21 ENCOUNTER — Ambulatory Visit (INDEPENDENT_AMBULATORY_CARE_PROVIDER_SITE_OTHER): Payer: Medicare Other | Admitting: Urology

## 2017-09-21 VITALS — BP 139/77 | HR 72 | Ht 62.0 in | Wt 152.8 lb

## 2017-09-21 DIAGNOSIS — C678 Malignant neoplasm of overlapping sites of bladder: Secondary | ICD-10-CM | POA: Diagnosis not present

## 2017-09-21 LAB — URINALYSIS, COMPLETE
BILIRUBIN UA: NEGATIVE
Glucose, UA: NEGATIVE
Ketones, UA: NEGATIVE
NITRITE UA: NEGATIVE
PH UA: 7 (ref 5.0–7.5)
Protein, UA: NEGATIVE
Specific Gravity, UA: 1.015 (ref 1.005–1.030)
Urobilinogen, Ur: 1 mg/dL (ref 0.2–1.0)

## 2017-09-21 LAB — MICROSCOPIC EXAMINATION

## 2017-09-21 MED ORDER — CIPROFLOXACIN HCL 500 MG PO TABS
500.0000 mg | ORAL_TABLET | Freq: Once | ORAL | Status: AC
  Administered 2017-09-21: 500 mg via ORAL

## 2017-09-21 MED ORDER — LIDOCAINE HCL 2 % EX GEL
1.0000 "application " | Freq: Once | CUTANEOUS | Status: AC
  Administered 2017-09-21: 1 via URETHRAL

## 2017-09-21 NOTE — Progress Notes (Signed)
   09/21/17  CC:  Chief Complaint  Patient presents with  . Cysto    HPI: The patient presents today for post induction BCG TURBT for possible recurrence of bladder cancer. She had a 2 cm papular tumor on the right lateral wall with adjacent 1 cm satellite tumor near the bladder neck. These were found during hematuria workup that was otherwise negative. Her bladder tumor washigh-grade pT1 papillary inverted urothelial carcinoma. It was indeterminate if there was muscularispropria in the specimen.   She underwent repeat TURBT due to her stage. There was muscle in the specimen however there was no residual carcinoma.  She completed induction BCG in April 2018.  First post-induction cystoscopy showed: Anecrotic lesion on the right lateral wall in apparent TURBT scar. She also has an erythematous 2-3 cm lesion on the left upper lateral wall concerning for a new neoplasm. There are various erythematous patches at the bladder that are possibly CIS.  However repeat TUR of this area was benign in July 2018.  Urine cytology at this time cystoscopy in November 2018 was negative.  She completed her first maintenance BCG in November 2018.  The plan is for 3 years of total maintenance BCG.    Blood pressure 139/77, pulse 72, height 5\' 2"  (1.575 m), weight 152 lb 12.8 oz (69.3 kg). NED. A&Ox3.   No respiratory distress   Abd soft, NT, ND Normal external genitalia with patent urethral meatus  Cystoscopy Procedure Note  Patient identification was confirmed, informed consent was obtained, and patient was prepped using Betadine solution.  Lidocaine jelly was administered per urethral meatus.    Preoperative abx where received prior to procedure.    Procedure: - Flexible cystoscope introduced, without any difficulty.   - Thorough search of the bladder revealed:    normal urethral meatus    normal urothelium    no stones    no ulcers     no tumors    no urethral polyps    no  trabeculation    There was some erythema at the previously mentioned sites on right and left lateral wall that were previously re-resected that showed no evidence of tumor just chronic cystitis on previous pathology.  There is no exophytic tumor or new lesions.  - Ureteral orifices were normal in position and appearance.  Post-Procedure: - Patient tolerated the procedure well  Assessment/ Plan:  1. High-risk non-muscle invasive bladder cancer  She will need to continue surveillance cystoscopy with repeat cystoscopy in 3 months. She'll need maintenance BCG at that time. She'll need 3 years total of maintenance BCG (through November 2021).  [name], MD

## 2017-10-30 IMAGING — CT CT ABD-PEL WO/W CM
2 of 10 series · 10 of 46 positions shown, 16 images · IV contrast (isovue)
Comparison: None.

CLINICAL DATA: Hematuria, burning and right lower quadrant pain
with urination

EXAM:
CT ABDOMEN AND PELVIS WITHOUT AND WITH CONTRAST
TECHNIQUE: Multidetector CT imaging of the abdomen and pelvis was performed
following the standard protocol before and following the bolus
administration of intravenous contrast.
CONTRAST:  100 cc IV Isovue 370

[Series 2: axial pre · axial · non-contrast · 0.73mm/px · z∈[-429,-69]mm · 8 of 94 slices shown, 13 images]
[im 11/94  soft-tissue]
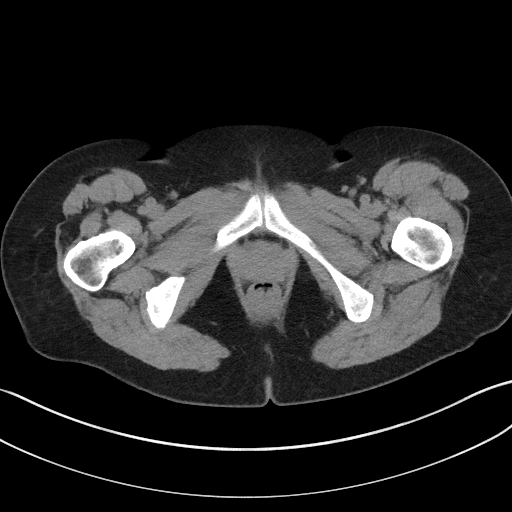
[im 11/94  bone]
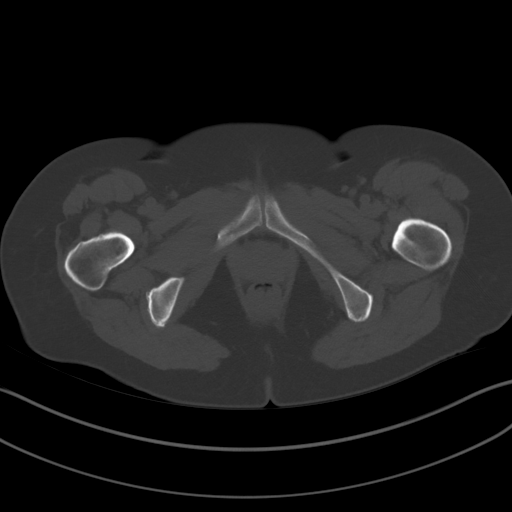
[im 21/94  soft-tissue]
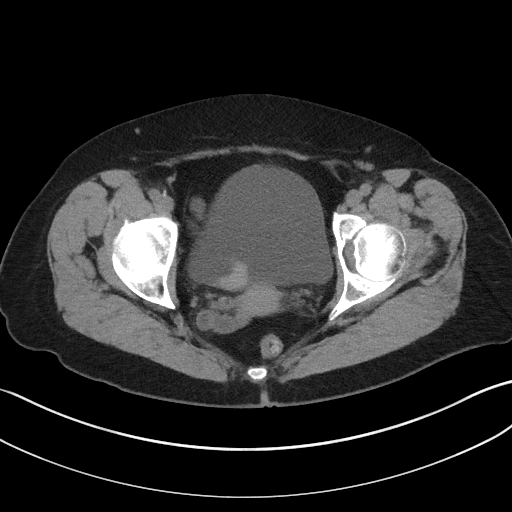
[im 32/94  soft-tissue]
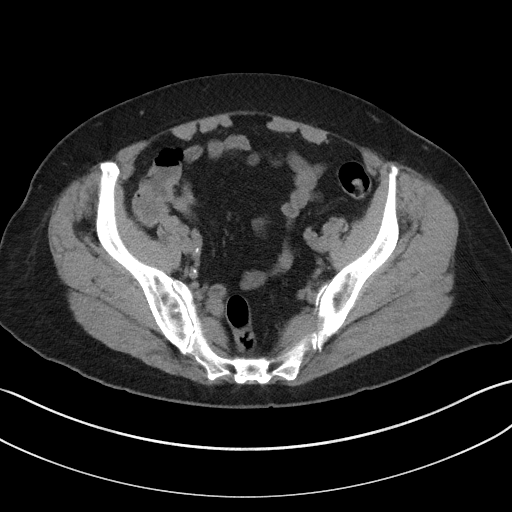
[im 42/94  soft-tissue]
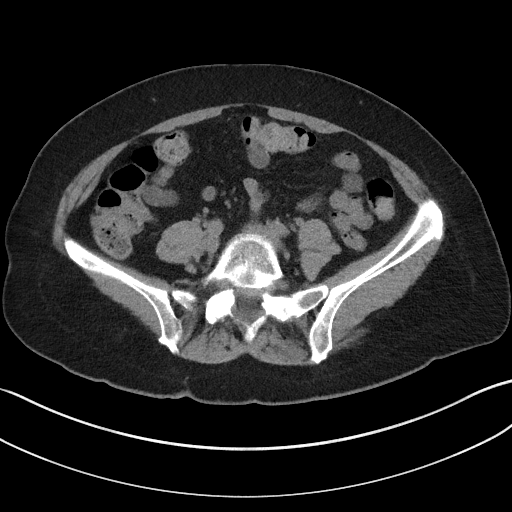
[im 52/94  soft-tissue]
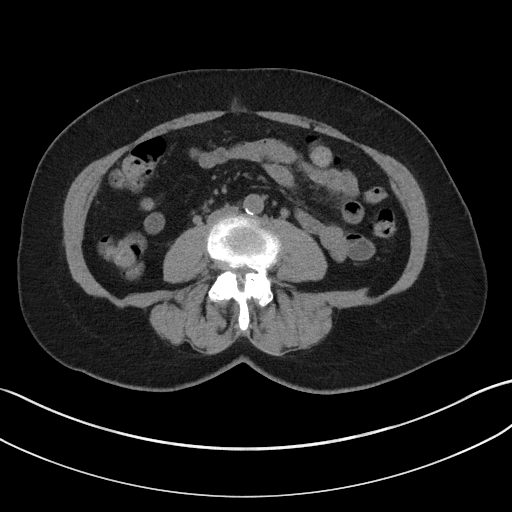
[im 52/94  lung]
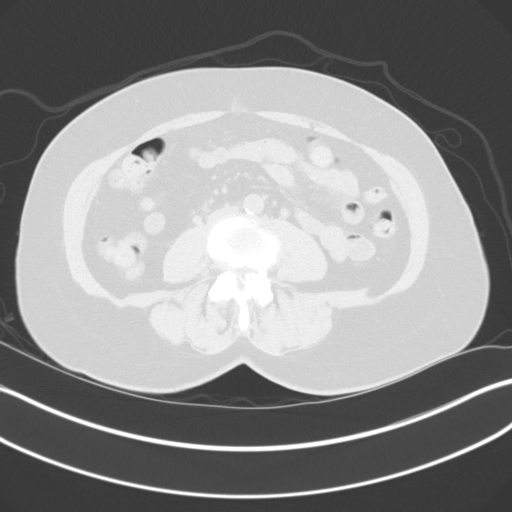
[im 63/94  soft-tissue]
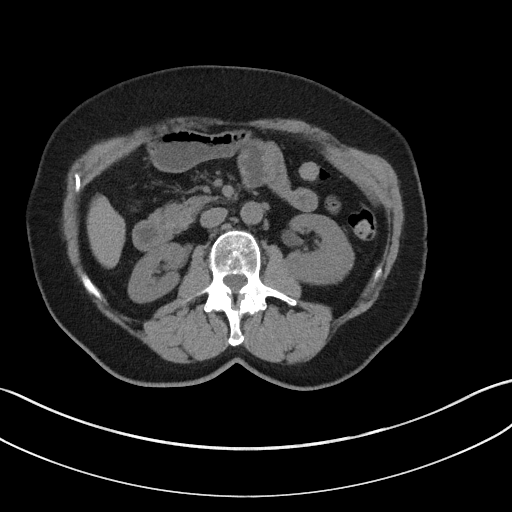
[im 63/94  lung]
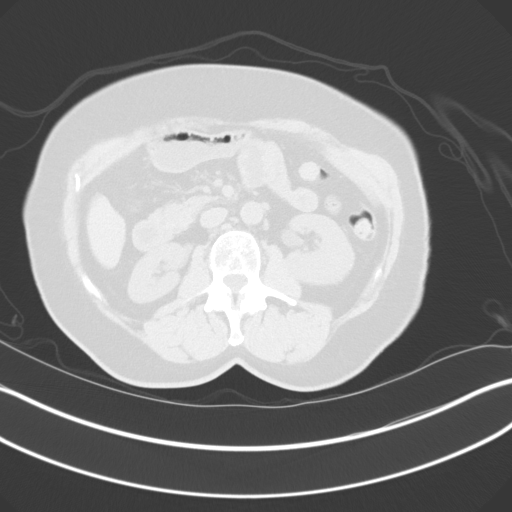
[im 73/94  soft-tissue]
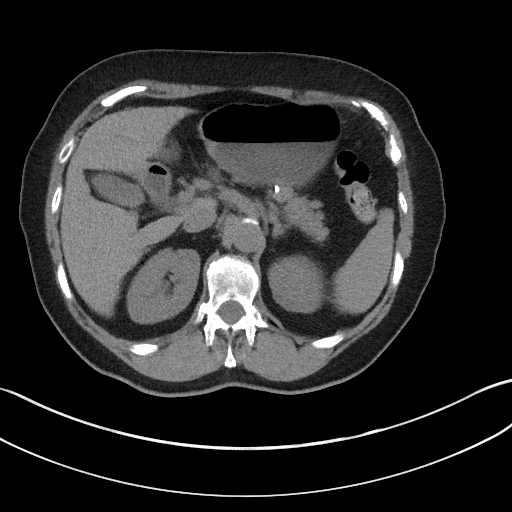
[im 73/94  lung]
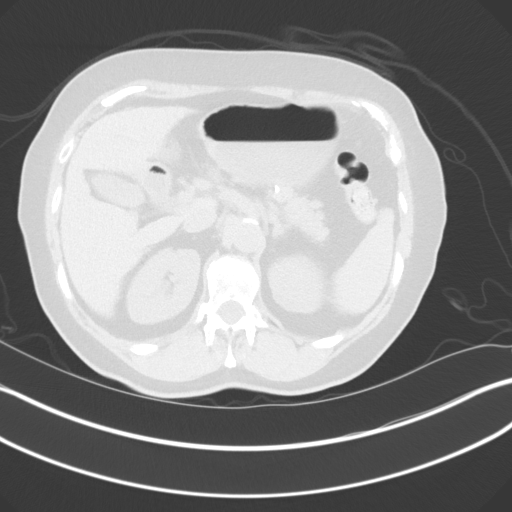
[im 83/94  soft-tissue]
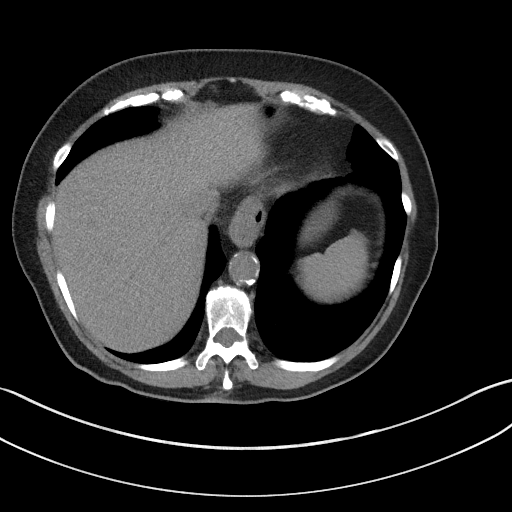
[im 83/94  lung]
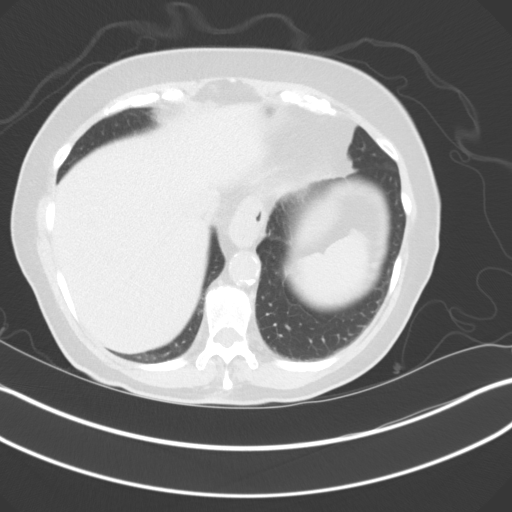

[Series 6: coronal pre · coronal · non-contrast · 0.76mm/px · 2 of 85 slices shown, 3 images]
[im 29/85  soft-tissue]
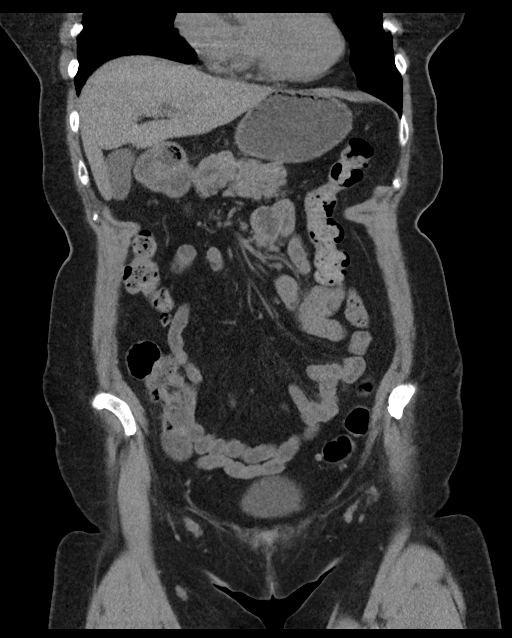
[im 29/85  bone]
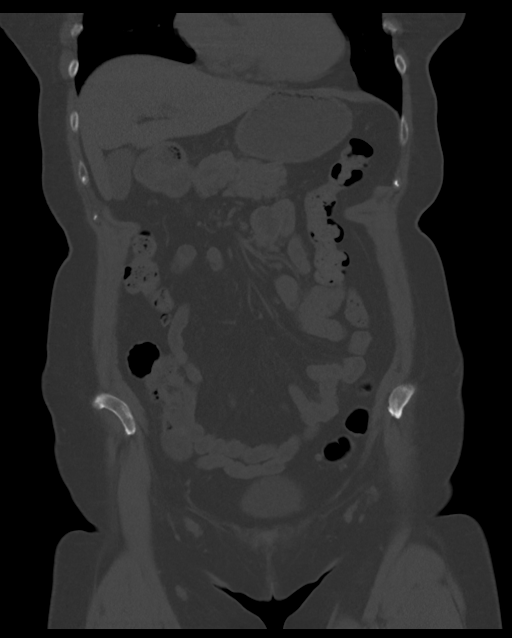
[im 57/85  soft-tissue]
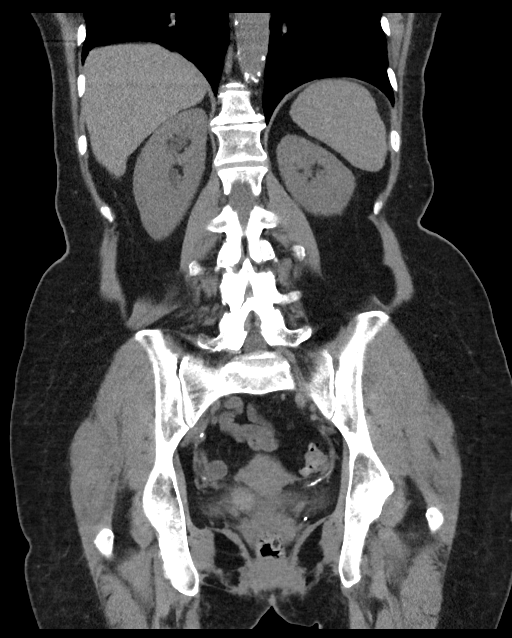

[10 of 46 positions shown; findings below may reference images not displayed]

FINDINGS: Lower chest: Bibasilar dependent atelectasis. No effusion or
pneumothorax. Normal cardiac chambers size. Small hiatal hernia.

Hepatobiliary: No focal liver abnormality is seen. No gallstones,
gallbladder wall thickening, or biliary dilatation.

Pancreas: Unremarkable. No pancreatic ductal dilatation or
surrounding inflammatory changes.

Spleen: 5 mm hypodensity in the anterior aspect of the spleen too
small to further characterize but may represent a tiny cyst or
hemangioma.

Adrenals/Urinary Tract: There is no nephrolithiasis of either
kidney. There are 2 subcentimeter hypodensities in the upper pole of
the left kidney consistent with cysts but too small to accurately
characterize, the larger of which measures 9 mm. These are noted on
series 4, image 20. Within the bladder on the unenhanced study is a
lobular hyperdense 2.7 x 2.1 cm masslike abnormality which appears
to change with patient change in position from the supine to prone
position, series 2, image 76 in the supine position and series 12,
image 70 in the prone position. This would be in keeping with
intraluminal clot. No visible stalk to suggest a large polyp.
Clinical correlation recommended. Bilateral ureteral jets are
demonstrated on series 12, image 76 suggesting patency of both
ureters.

Stomach/Bowel: Unremarkable

Vascular/Lymphatic: Aortic atherosclerosis. No enlarged abdominal or
pelvic lymph nodes.

Reproductive: Uterus and bilateral adnexa are unremarkable.

Other: Small fat containing umbilical hernia.

Musculoskeletal: No acute or suspicious osseous abnormalities. Right
SI joint osteoarthritic sclerosis. Degenerative disc disease T11-12.
Minimal anterolisthesis L3 on L4 with slight disc space narrowing.
Hypertrophy and sclerosis at the L3 through S1 facets.
IMPRESSION: Mobile lobular 2.7 x 2.1 cm intraluminal masslike abnormality within
the bladder consistent with blood clot. Clinical correlation is
recommended. Patent bilateral ureters. No nephrolithiasis. Two
subcentimeter hypodensities in the upper pole of the left kidney
consistent with cysts but too small to further characterize.

## 2017-11-01 ENCOUNTER — Emergency Department
Admission: EM | Admit: 2017-11-01 | Discharge: 2017-11-01 | Disposition: A | Discharge status: Home / Self Care | Payer: Medicare Other | Attending: Emergency Medicine | Admitting: Emergency Medicine

## 2017-11-01 ENCOUNTER — Emergency Department: Payer: Medicare Other

## 2017-11-01 DIAGNOSIS — R55 Syncope and collapse: Secondary | ICD-10-CM | POA: Diagnosis present

## 2017-11-01 DIAGNOSIS — Z7982 Long term (current) use of aspirin: Secondary | ICD-10-CM | POA: Diagnosis not present

## 2017-11-01 DIAGNOSIS — Z79899 Other long term (current) drug therapy: Secondary | ICD-10-CM | POA: Diagnosis not present

## 2017-11-01 DIAGNOSIS — Z87891 Personal history of nicotine dependence: Secondary | ICD-10-CM | POA: Insufficient documentation

## 2017-11-01 DIAGNOSIS — H8110 Benign paroxysmal vertigo, unspecified ear: Secondary | ICD-10-CM | POA: Insufficient documentation

## 2017-11-01 DIAGNOSIS — I10 Essential (primary) hypertension: Secondary | ICD-10-CM | POA: Insufficient documentation

## 2017-11-01 LAB — COMPREHENSIVE METABOLIC PANEL
ALK PHOS: 45 U/L (ref 38–126)
ALT: 30 U/L (ref 14–54)
AST: 35 U/L (ref 15–41)
Albumin: 4 g/dL (ref 3.5–5.0)
Anion gap: 8 (ref 5–15)
BILIRUBIN TOTAL: 0.9 mg/dL (ref 0.3–1.2)
BUN: 10 mg/dL (ref 6–20)
CALCIUM: 9.1 mg/dL (ref 8.9–10.3)
CO2: 25 mmol/L (ref 22–32)
CREATININE: 0.67 mg/dL (ref 0.44–1.00)
Chloride: 107 mmol/L (ref 101–111)
GFR calc non Af Amer: >60 mL/min (ref >60)
Glucose, Bld: 122 mg/dL — ABNORMAL HIGH (ref 65–99)
Potassium: 3.5 mmol/L (ref 3.5–5.1)
SODIUM: 140 mmol/L (ref 135–145)
TOTAL PROTEIN: 7.6 g/dL (ref 6.5–8.1)

## 2017-11-01 LAB — TROPONIN I: Troponin I: <0.03 ng/mL (ref <0.03)

## 2017-11-01 LAB — CBC
HEMATOCRIT: 41.7 % (ref 35.0–47.0)
HEMOGLOBIN: 14.2 g/dL (ref 12.0–16.0)
MCH: 31.4 pg (ref 26.0–34.0)
MCHC: 34.1 g/dL (ref 32.0–36.0)
MCV: 92.1 fL (ref 80.0–100.0)
Platelets: 213 10*3/uL (ref 150–440)
RBC: 4.53 MIL/uL (ref 3.80–5.20)
RDW: 14 % (ref 11.5–14.5)
WBC: 7.6 10*3/uL (ref 3.6–11.0)

## 2017-11-01 MED ORDER — MECLIZINE HCL 25 MG PO TABS
50.0000 mg | ORAL_TABLET | Freq: Once | ORAL | Status: AC
  Administered 2017-11-01: 50 mg via ORAL
  Filled 2017-11-01: qty 2

## 2017-11-01 MED ORDER — MECLIZINE HCL 25 MG PO TABS: 25.0000 mg | ORAL_TABLET | Freq: Three times a day (TID) | ORAL | 0 refills | Status: DC | PRN

## 2017-11-01 NOTE — ED Provider Notes (Signed)
[facility] Emergency Department Provider Note    First MD Initiated Contact with Patient 11/01/17 [phone]     (approximate)  I have reviewed the triage vital signs and the nursing notes.   HISTORY  Chief Complaint Near Syncope    HPI Kim Espinoza is a 77 y.o. 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  Patient admits to nausea however denies any vomiting.  Patient denies any headache.  Patient denies any weakness or numbness.  Past Medical History:  Diagnosis Date  . Abnormal mammogram 2011, 2012   Right side.   . Anxiety   . Breast screening, unspecified   . Cancer ([place])    "bladder stage 1"  . Hypercholesterolemia since 2001  . Hypertension    since approx. 2001  . Mammographic microcalcification   . Personal history of tobacco use, presenting hazards to health   . Special screening for malignant neoplasms, colon   . 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 Acute pelvic pain, female 07/19/2016  . Fibromyalgia 07/19/2016  . Left-sided carotid artery disease ([place]) 07/19/2016  . Menopause 07/19/2016  . Pleurisy 07/19/2016  . Tobacco dependence 07/19/2016  . Essential hypertension, benign 11/20/2012  . Abnormal mammogram 05/01/2012  . Hypertension 05/01/2012    Past Surgical History:  Procedure Laterality Date  . BREAST BIOPSY Right ??   x2 - core w/clip - neg  . BREAST BIOPSY-right  2011   Stereotactic - Dr. Bary Castilla- Benign breast tissue containing stromal calcifications wiithin.  Periductal Fibrosis.  Marland Kitchen BREAST SURGERY Right 2011, 2013   stero biopsy  . CAROTID ENDARTERECTOMY  2004  . CATARACT EXTRACTION EXTRACAPSULAR  2013   Left eye  . COLONOSCOPY  2012   Dr. Bary Castilla  . EYE SURGERY Left     cataract   . TONSILLECTOMY    . TRANSURETHRAL RESECTION OF BLADDER TUMOR N/A 08/30/2016   Procedure: TRANSURETHRAL RESECTION OF BLADDER TUMOR (TURBT) SMALL;  Surgeon: [name], MD;  Location: [facility] ORS;  Service: Urology;  Laterality: N/A;  . TRANSURETHRAL RESECTION OF BLADDER TUMOR N/A 03/14/2017   Procedure: TRANSURETHRAL RESECTION OF BLADDER TUMOR (TURBT)-MEDIUM;  Surgeon: [name], MD;  Location: [facility] ORS;  Service: Urology;  Laterality: N/A;  . TRANSURETHRAL RESECTION OF BLADDER TUMOR WITH MITOMYCIN-C N/A 07/11/2016   Procedure: TRANSURETHRAL RESECTION OF BLADDER TUMOR WITH MITOMYCIN-C;  Surgeon: [name], MD;  Location: [facility] ORS;  Service: Urology;  Laterality: N/A;  . TUBAL LIGATION    . VASCULAR SURGERY Left 2004   carotid endarderectomy    Prior to Admission medications   Medication Sig Start Date End Date Taking? Authorizing Provider  ALPRAZolam Duanne Moron) 0.25 MG tablet Take 0.25 mg by mouth daily as needed for anxiety.    [provider]  amLODipine (NORVASC) 5 MG tablet Take 5 mg by mouth daily.  07/03/16 07/03/17  [provider]  aspirin EC 81 MG tablet Take 81 mg by mouth every evening.     [provider]  atenolol (TENORMIN) 50 MG tablet Take 50 mg by mouth 2 (two) times daily.    [provider]  B Complex-C (B-COMPLEX WITH VITAMIN C) tablet Take 1 tablet by mouth every evening.    [provider]  BCG vaccine (THERACYS) 81 MG/VIAL injection by Intravesical route.    [provider]  Flaxseed, Linseed, (FLAXSEED OIL) 1000 MG CAPS Take 1,000 mg by mouth every evening.     [provider]  FOLIC ACID PO Take 1 tablet by mouth daily.    [provider]  HYDROcodone-acetaminophen (NORCO) 5-325 MG tablet Take 1-2 tablets by mouth every 4 (four) hours as needed for moderate pain. 03/14/17   [name], MD  nortriptyline (PAMELOR) 25 MG capsule  08/09/17   [provider]  Pyridoxine HCl (VITAMIN B-6) 250 MG tablet Take 250 mg by mouth every evening.     [provider]  simvastatin (ZOCOR) 10 MG tablet Take 10 mg by mouth daily.     [provider]  vitamin B-12 (CYANOCOBALAMIN) 1000 MCG tablet Take 1,000 mcg by mouth every evening.     [provider]    Allergies No known drug allergies  Family History  Problem Relation Age of Onset  . Cancer Father        lung cancer - deceased  . Cancer Sister 67       breast cancer - alive and well  . Breast cancer Sister 49  . Breast cancer Paternal Aunt   . Colon cancer Neg Hx   . Ovarian cancer Neg Hx   . Hematuria Neg Hx   . Prostate cancer Neg Hx   . Bladder Cancer Neg Hx     Social History Social History   Tobacco Use  . Smoking status: Former Smoker    Last attempt to quit: 06/29/2006    Years since quitting: 11.3  . Smokeless tobacco: Never Used  . Tobacco comment: smoking a little now since anxious for surgery  Substance Use Topics  . Alcohol use: No    Comment: never drinks alcohol  . Drug use: No    Review of Systems Constitutional: No fever/chills Eyes: No visual changes. ENT: No sore throat. Cardiovascular: Denies chest pain. Respiratory: Denies shortness of breath. Gastrointestinal: No abdominal pain.  No nausea, no vomiting.  No diarrhea.  No constipation. Genitourinary: Negative for dysuria. Musculoskeletal: Negative for neck pain.  Negative for back pain. Integumentary: Negative for rash. Neurological: Negative for headaches, focal weakness or numbness.  Positive for dizziness   ____________________________________________   PHYSICAL EXAM:  VITAL SIGNS: ED Triage Vitals  Enc Vitals Group     BP 11/01/17 0420 (!) 169/71     Pulse Rate 11/01/17 0420 74     Resp 11/01/17 0420 17     Temp 11/01/17 0420 98.1 F (36.7 C)     Temp Source 11/01/17 0420 Oral     SpO2 11/01/17 0420 98 %     Weight 11/01/17 0421 68.9 kg (152 lb)     Height  11/01/17 0421 1.626 m (5\' 4" )     Head Circumference --      Peak Flow --      Pain Score --      Pain Loc --      Pain Edu? --      Excl. in [place]? --     Constitutional: Alert and oriented. Well appearing and in no acute distress. Eyes: Conjunctivae are normal. PERRL. EOMI. Head: Atraumatic. Mouth/Throat: Mucous membranes are moist. Oropharynx non-erythematous. Neck: No stridor.   Cardiovascular: Normal rate, regular rhythm. Good peripheral circulation. Grossly normal heart sounds. Respiratory: Normal respiratory effort.  No retractions. Lungs CTAB. Gastrointestinal: Soft and nontender. No distention.  Musculoskeletal: No lower extremity tenderness nor edema. No gross deformities of extremities. Neurologic:  Normal speech and language. No gross focal neurologic deficits are appreciated.  Positive Hallpike maneuver Skin:  Skin is warm, dry and intact. No rash noted. Psychiatric: Mood and affect are normal. Speech and behavior are normal.  ____________________________________________   LABS (all labs ordered are listed, but only abnormal results are displayed)  Labs Reviewed  COMPREHENSIVE METABOLIC PANEL - Abnormal; Notable for the following components:      Result Value   Glucose, Bld 122 (*)    All other components within normal limits  CBC  TROPONIN I   ____________________________________________  EKG  ED ECG REPORT I, Burke N Davin Archuletta, the attending physician, personally viewed and interpreted this ECG.   Date: 11/01/2017  EKG Time: 4:28 AM  Rate: 77  Rhythm: Normal sinus rhythm  Axis: Normal  Intervals:Normal  ST&T Change: None  ____________________________________________  RADIOLOGY I, [place] Nyimah Shadduck, personally viewed and evaluated these images (plain radiographs) as part of my medical decision making, as well as reviewing the written report by the radiologist.    Official radiology report(s): Ct Head Wo Contrast  Result Date: 11/01/2017 CLINICAL  DATA:  Acute onset of headache, dizziness and nausea. EXAM: CT HEAD WITHOUT CONTRAST TECHNIQUE: Contiguous axial images were obtained from the base of the skull through the vertex without intravenous contrast. COMPARISON:  None. FINDINGS: Brain: No evidence of acute infarction, hemorrhage, hydrocephalus, extra-axial collection or mass lesion / mass effect. Prominence of the ventricles and sulci reflects mild cortical volume loss. Mild cerebellar atrophy is noted. Scattered periventricular and subcortical matter change likely reflects small vessel ischemic microangiopathy. The brainstem and fourth ventricle are within normal limits. The basal ganglia are unremarkable in appearance. The cerebral hemispheres demonstrate grossly normal gray-white differentiation. No mass effect or midline shift is seen. Vascular: No hyperdense vessel or unexpected calcification. Skull: There is no evidence of fracture; visualized osseous structures are unremarkable in appearance. Sinuses/Orbits: The visualized portions of the orbits are within normal limits. The paranasal sinuses and mastoid air cells are well-aerated. Other: No significant soft tissue abnormalities are seen. IMPRESSION: 1. No acute intracranial pathology seen on CT. 2. Mild cortical volume loss and scattered small vessel ischemic microangiopathy. Electronically Signed   By: Garald Balding M.D.   On: 11/01/2017 05:12      Procedures   ____________________________________________   INITIAL IMPRESSION / ASSESSMENT AND PLAN / ED COURSE  As part of my medical decision making, I reviewed the following data within the electronic MEDICAL RECORD NUMBER    77 year old female presented with above-stated history is ago exam secondary to vertigo.  Patient did not recall ever receiving a CT scan of her or an MRI of her brain.  CT performed today of the head revealed no acute intracranial pathology.  Patient does recall previous episodes of vertigo in the past as stated  above which she was prescribed meclizine patient received meclizine in the emergency department with resolution of symptoms.  Patient will be prescribed meclizine for home ____________________________________________  FINAL CLINICAL IMPRESSION(S) / ED DIAGNOSES  Final diagnoses:  Benign paroxysmal positional vertigo, unspecified laterality     MEDICATIONS GIVEN DURING THIS VISIT:  Medications  meclizine (ANTIVERT) tablet 50 mg (50 mg Oral Given 11/01/17 0436)     ED Discharge Orders    None       Note:  This document was prepared using Dragon voice recognition software and may include unintentional dictation errors.  Gregor Hams, MD 11/01/17 [phone]

## 2017-11-01 NOTE — ED Triage Notes (Signed)
Pt arrived via EMS from home with complaints of dizziness, nausea, and HA. Pt reported that she tried to get up to go to bathroom tonight when she woke up in a cold sweat and her head was very dizzy. Pt is alert and oriented x4. VS per EMS BP-160/80 HR-70s Normal sinus on monitor. IV per EMS 18 gauge right AC. Pt has Hx of HTN and bladder cancer. Pt was given Zofran 4mg  per EMS. Pt is a DNR.

## 2017-11-14 IMAGING — US US CAROTID DUPLEX BILAT
1 series · 13 of 24 positions shown · non-contrast
Comparison: 02/18/2007

CLINICAL DATA: Peripheral vascular disease. History of left carotid
endarterectomy in 9336.

EXAM:
BILATERAL CAROTID DUPLEX ULTRASOUND
TECHNIQUE: Gray scale imaging, color Doppler and duplex ultrasound were
performed of bilateral carotid and vertebral arteries in the neck.

[Series 1: us carotid duplex bilat · 0.06mm/px · 13 of 68 slices shown]
[im 1/68]
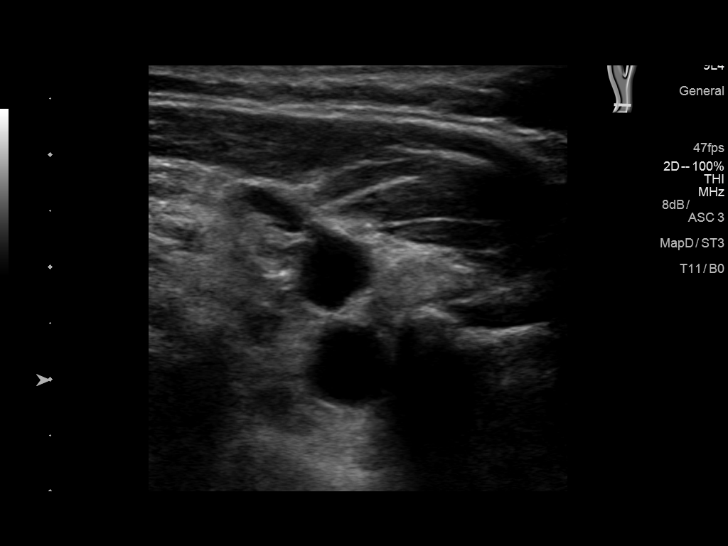
[im 6/68]
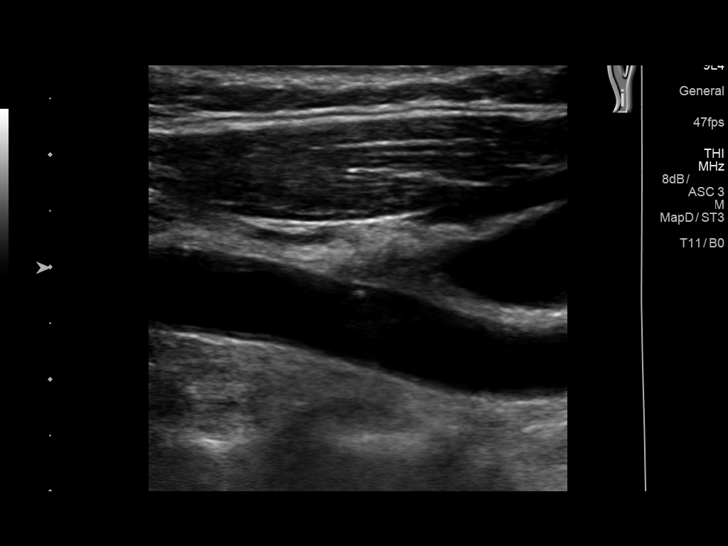
[im 12/68]
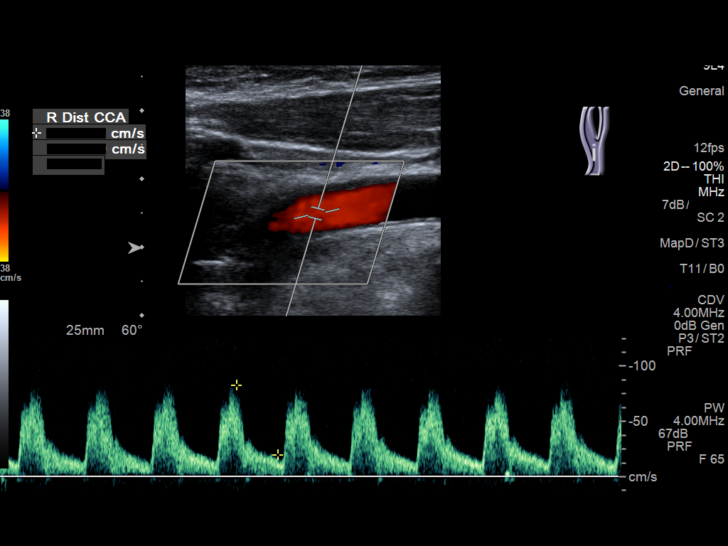
[im 18/68]
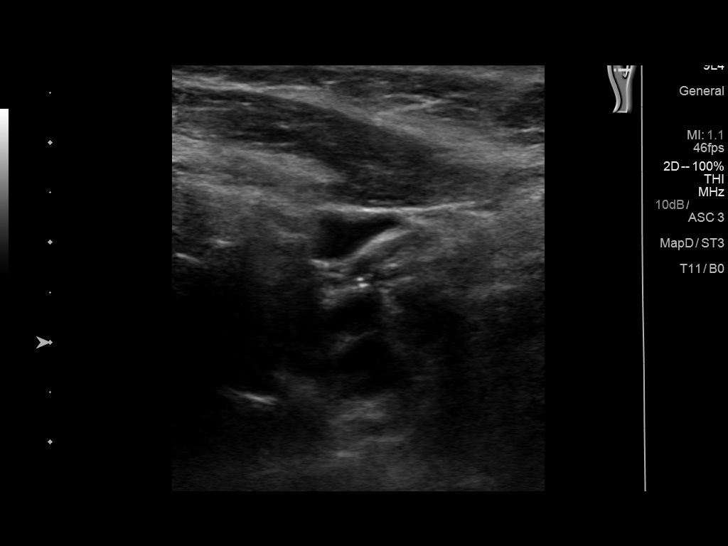
[im 24/68]
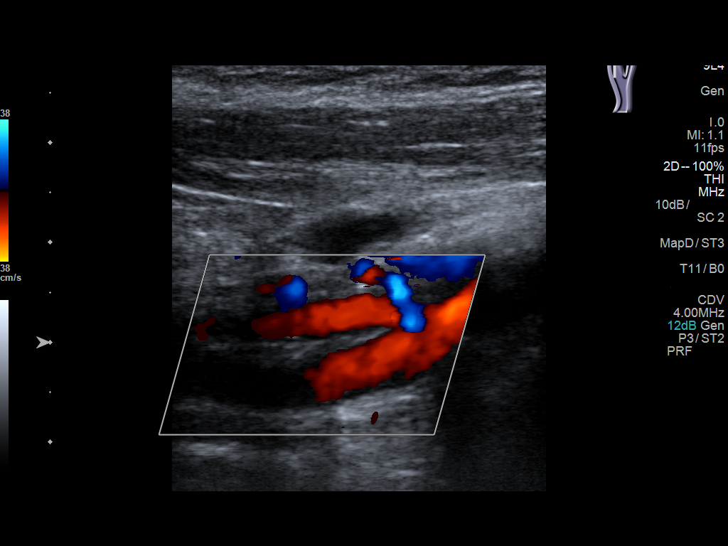
[im 30/68]
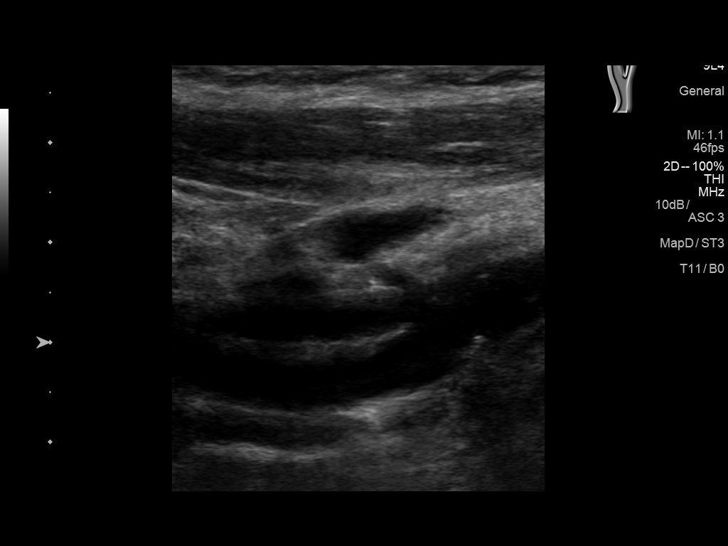
[im 35/68]
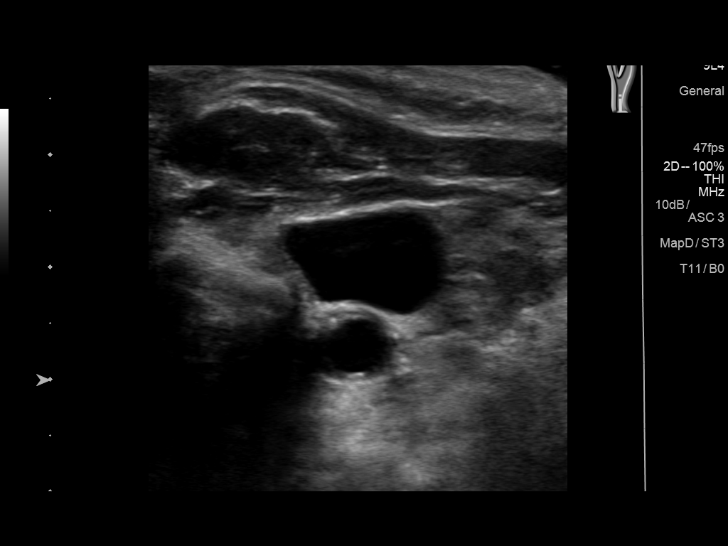
[im 38/68]
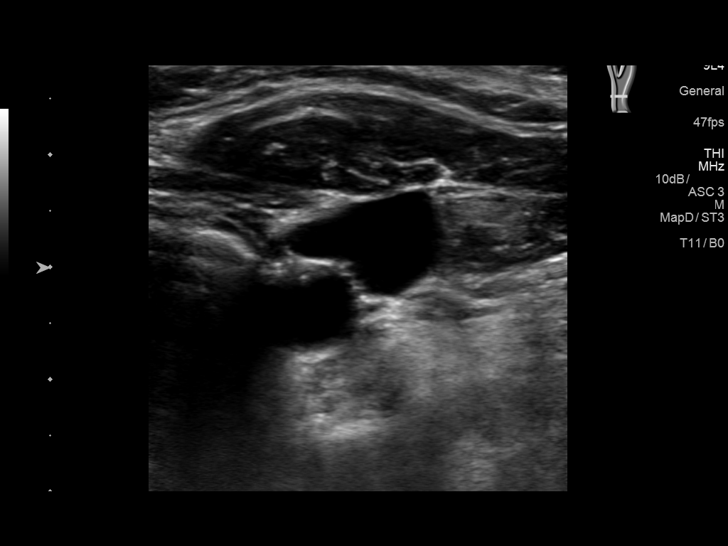
[im 44/68]
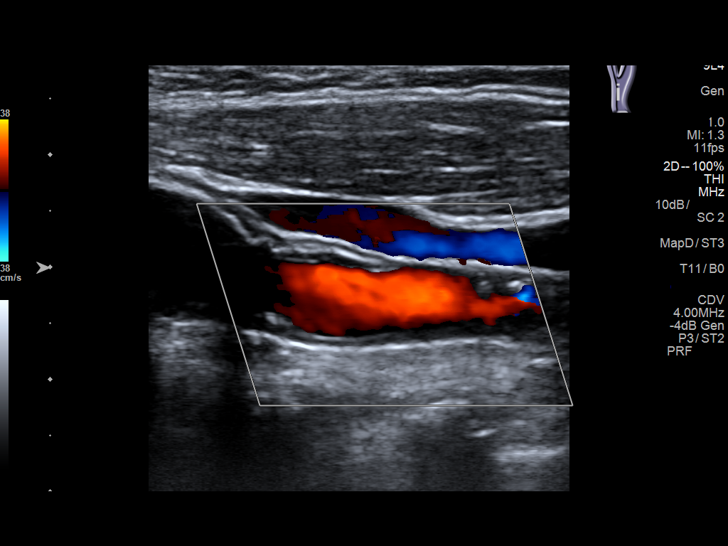
[im 50/68]
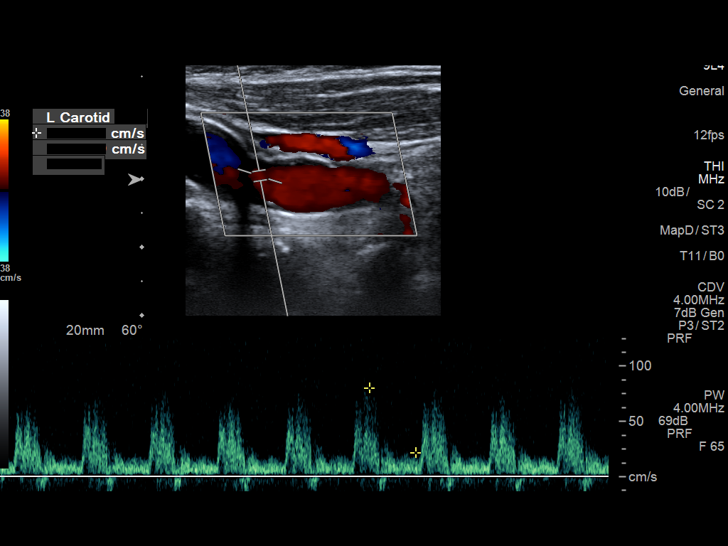
[im 56/68]
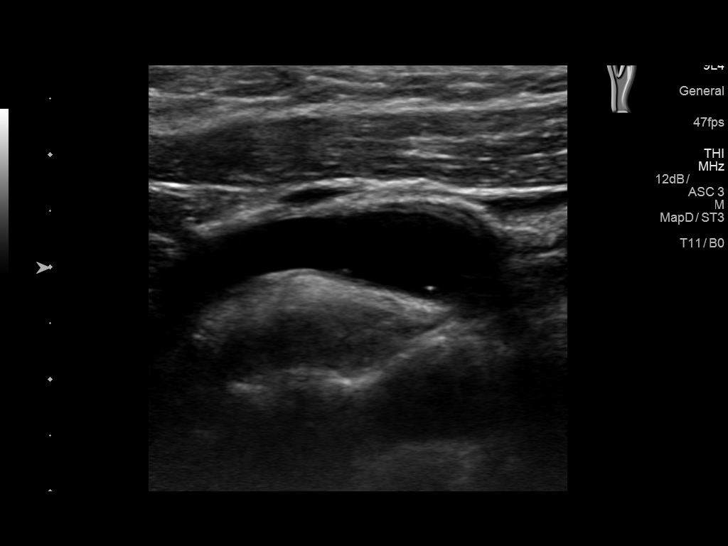
[im 62/68]
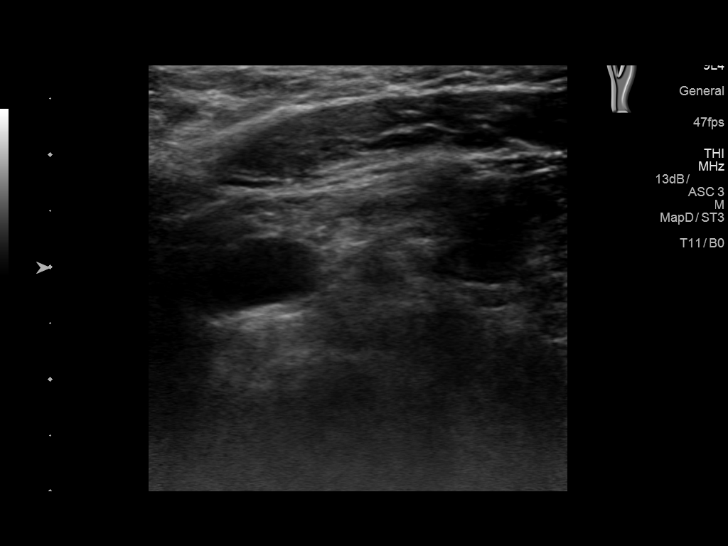
[im 68/68]
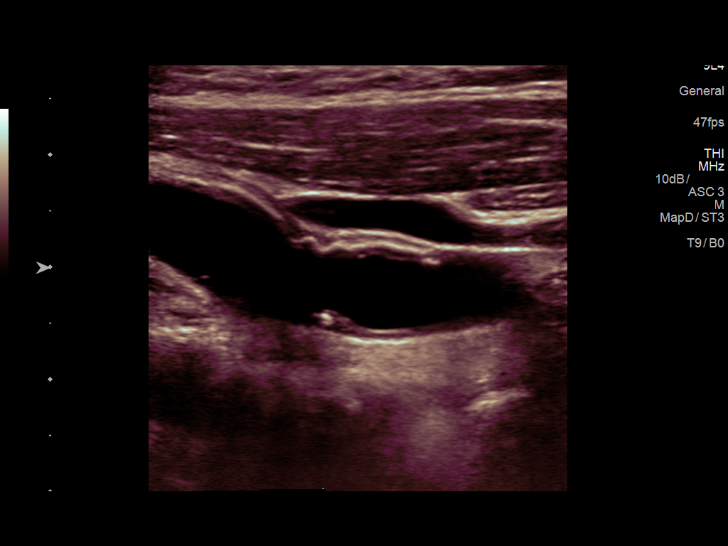

[13 of 24 positions shown; findings below may reference images not displayed]

FINDINGS: Criteria: Quantification of carotid stenosis is based on velocity
parameters that correlate the residual internal carotid diameter
with NASCET-based stenosis levels, using the diameter of the distal
internal carotid lumen as the denominator for stenosis measurement.

The following velocity measurements were obtained:

RIGHT

ICA:  87 cm/sec

CCA:  86 cm/sec

SYSTOLIC ICA/CCA RATIO:

DIASTOLIC ICA/CCA RATIO:

ECA:  113 cm/sec

LEFT

ICA:  143 cm/sec

CCA:  131 cm/sec

SYSTOLIC ICA/CCA RATIO:

DIASTOLIC ICA/CCA RATIO:

ECA:  108 cm/sec

RIGHT CAROTID ARTERY: Mild plaque in the right common carotid
artery. Small amount of echogenic plaque at the right carotid bulb.
No significant stenosis in the right internal carotid artery. Normal
waveforms and velocities in the right internal carotid artery.

RIGHT VERTEBRAL ARTERY: Antegrade flow and normal waveform in the
right vertebral artery.

LEFT CAROTID ARTERY: Intimal thickening and plaque in the left
common carotid artery. There is mild disease at the left carotid
bulb. The peak systolic velocity in the distal left internal carotid
artery is mildly elevated measuring 143 cm/sec, but the left carotid
artery ratios are normal at 1.1.

LEFT VERTEBRAL ARTERY: Antegrade flow and normal waveform in the
left vertebral artery.
IMPRESSION: Mild atherosclerotic disease in the carotid arteries, left side
greater than right. Although the peak systolic velocity in left
internal carotid artery is mildly elevated, the carotid artery
ratios are within normal limits. Estimated degree of stenosis in the
bilateral internal carotid arteries is less than 50% bilaterally.

Patent vertebral arteries.

## 2017-12-20 ENCOUNTER — Other Ambulatory Visit: Payer: Medicare Other

## 2017-12-27 ENCOUNTER — Encounter: Payer: Self-pay | Admitting: Urology

## 2017-12-27 ENCOUNTER — Ambulatory Visit: Payer: Medicare Other | Admitting: Urology

## 2017-12-27 VITALS — BP 150/69 | HR 76 | Ht 64.0 in | Wt 151.0 lb

## 2017-12-27 DIAGNOSIS — Z8551 Personal history of malignant neoplasm of bladder: Secondary | ICD-10-CM

## 2017-12-27 DIAGNOSIS — C679 Malignant neoplasm of bladder, unspecified: Secondary | ICD-10-CM | POA: Diagnosis not present

## 2017-12-27 LAB — URINALYSIS, COMPLETE
Bilirubin, UA: NEGATIVE
GLUCOSE, UA: NEGATIVE
KETONES UA: NEGATIVE
LEUKOCYTES UA: NEGATIVE
Nitrite, UA: NEGATIVE
Protein, UA: NEGATIVE
SPEC GRAV UA: 1.025 (ref 1.005–1.030)
Urobilinogen, Ur: 0.2 mg/dL (ref 0.2–1.0)
pH, UA: 5 (ref 5.0–7.5)

## 2017-12-27 LAB — MICROSCOPIC EXAMINATION
RBC MICROSCOPIC, UA: NONE SEEN /HPF (ref 0–2)
WBC, UA: NONE SEEN /hpf (ref 0–5)

## 2017-12-27 MED ORDER — CIPROFLOXACIN HCL 500 MG PO TABS
500.0000 mg | ORAL_TABLET | Freq: Once | ORAL | Status: AC
  Administered 2017-12-27: 500 mg via ORAL

## 2017-12-27 MED ORDER — LIDOCAINE HCL URETHRAL/MUCOSAL 2 % EX GEL
1.0000 "application " | Freq: Once | CUTANEOUS | Status: AC
  Administered 2017-12-27: 1 via URETHRAL

## 2017-12-27 NOTE — Progress Notes (Signed)
   12/31/17  CC:  Chief Complaint  Patient presents with  . Cysto    HPI: 77 year old female who presents today for surveillance cystoscopy.  Ms. Burklow was found to have 2 small tumors on the right lateral wall just lateral to the ureteral orifice. Each tumor was approximately 1 cm in size, on a narrow stalk, low-grade appearing during a cystoscopy performed on 06/27/2016 with Dr. Louis Meckel.    She underwent TURBT on 07/11/2016 with Dr. Erlene Quan.  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 It was indeterminate if there was muscularispropria in the specimen.  Repeated TURBT was performed on 08/30/2016 with Dr. Pilar Jarvis.  Pathology was negative for residual carcinoma and muscularis propria was present.   She completed a 6 weeks induction course of BCG on 12/07/2016.    She underwent a surveillance cystoscopy with Dr. Pilar Jarvis on 03/01/2017 and a necrotic lesion on the right lateral wallapparent TURBT scar. She also has an erythematous 2-3 cm lesion on the left upper lateral wall concerning for a new neoplasm. There are various erythematous patches at the bladder that arepossibly CIS. TURBT was completed on 03/14/2017 with Dr. Pilar Jarvis.  Pathology was benign.    Surveillance cystoscopy completed on 06/21/2017 with Dr. Pilar Jarvis noted Previous TUR sites noted on left and right lateral wall. Similar in appearance from time of negative TURBT in July 2018.  Cytology remained negative.  She completed maintenance BCG 06/2017 x 3.  Most recent cystoscopy in 09/2017 by Dr. Pilar Jarvis revealed chronic changes in the right lateral wall which remained stable.  She has no significant urinary complaints today.  No gross hematuria.    Blood pressure (!) 150/69, pulse 76, height 5\' 4"  (1.626 m), weight 151 lb (68.5 kg). NED. A&Ox3.   No respiratory distress   Abd soft, NT, ND Normal external genitalia with patent urethral meatus  Cystoscopy Procedure Note  Patient identification  was confirmed, informed consent was obtained, and patient was prepped using Betadine solution.  Lidocaine jelly was administered per urethral meatus.    Preoperative abx where received prior to procedure.    Procedure: - Flexible cystoscope introduced, without any difficulty.   - Thorough search of the bladder revealed:    normal urethral meatus    normal urothelium other than some nonspecific erythema on the right lateral wall, although cystoscopy performed last by Dr. Pilar Jarvis, based on his description, this appears to be unchanged and stable.  No new lesions or papillary tumors.    no stones    no ulcers     no tumors    no urethral polyps    no trabeculation  - Ureteral orifices were normal in position and appearance.  Post-Procedure: - Patient tolerated the procedure well  Assessment/ Plan:  1. History of bladder cancer Stable changes on right lateral bladder wall, will continue to survey closely on serial cystoscopy every 3 months Urine cytology sent today In light of intravesical BCG shortage, will defer her maintenance BCG and revisit this next visit based on its availability, she understands and is agreeable with this plan  Return in about 3 months (around 03/29/2018) for cysto.   - Urinalysis, Complete - ciprofloxacin (CIPRO) tablet 500 mg - lidocaine (XYLOCAINE) 2 % jelly 1 application   [name], MD

## 2018-01-01 ENCOUNTER — Other Ambulatory Visit: Payer: Self-pay | Admitting: Urology

## 2018-03-27 ENCOUNTER — Ambulatory Visit: Payer: Medicare Other | Admitting: Urology

## 2018-03-27 ENCOUNTER — Encounter: Payer: Self-pay | Admitting: Urology

## 2018-03-27 VITALS — BP 125/68 | HR 77 | Ht 64.0 in | Wt 149.0 lb

## 2018-03-27 DIAGNOSIS — C678 Malignant neoplasm of overlapping sites of bladder: Secondary | ICD-10-CM | POA: Diagnosis not present

## 2018-03-27 DIAGNOSIS — N3289 Other specified disorders of bladder: Secondary | ICD-10-CM

## 2018-03-27 DIAGNOSIS — Z8551 Personal history of malignant neoplasm of bladder: Secondary | ICD-10-CM

## 2018-03-27 LAB — URINALYSIS, COMPLETE
BILIRUBIN UA: NEGATIVE
GLUCOSE, UA: NEGATIVE
Ketones, UA: NEGATIVE
LEUKOCYTES UA: NEGATIVE
Nitrite, UA: NEGATIVE
PH UA: 5.5 (ref 5.0–7.5)
Specific Gravity, UA: 1.025 (ref 1.005–1.030)
UUROB: 1 mg/dL (ref 0.2–1.0)

## 2018-03-27 LAB — MICROSCOPIC EXAMINATION: RBC, UA: NONE SEEN /hpf (ref 0–2)

## 2018-03-27 MED ORDER — LIDOCAINE HCL URETHRAL/MUCOSAL 2 % EX GEL
1.0000 "application " | Freq: Once | CUTANEOUS | Status: AC
  Administered 2018-03-27: 1 via URETHRAL

## 2018-03-27 MED ORDER — CIPROFLOXACIN HCL 500 MG PO TABS
500.0000 mg | ORAL_TABLET | Freq: Once | ORAL | Status: AC
  Administered 2018-03-27: 500 mg via ORAL

## 2018-03-27 NOTE — Progress Notes (Signed)
   03/27/18  CC:  Chief Complaint  Patient presents with  . Cysto    HPI: 77 year old female who presents today for surveillance cystoscopy.  Kim Espinoza was found to have 2 small tumors on the right lateral wall just lateral to the ureteral orifice. Each tumor was approximately 1 cm in size, on a narrow stalk, low-grade appearing during a cystoscopy performed on 06/27/2016 with Dr. Louis Meckel.    She underwent TURBT on 07/11/2016 with Dr. Erlene Quan.  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 It was indeterminate if there was muscularispropria in the specimen.  Repeated TURBT was performed on 08/30/2016 with Dr. Pilar Jarvis.  Pathology was negative for residual carcinoma and muscularis propria was present.   She completed a 6 weeks induction course of BCG on 12/07/2016.    She underwent a surveillance cystoscopy with Dr. Pilar Jarvis on 03/01/2017 and a necrotic lesion on the right lateral wallapparent TURBT scar. She also has an erythematous 2-3 cm lesion on the left upper lateral wall concerning for a new neoplasm. There are various erythematous patches at the bladder that arepossibly CIS. TURBT was completed on 03/14/2017 with Dr. Pilar Jarvis.  Pathology was benign.    Surveillance cystoscopy completed on 06/21/2017 with Dr. Pilar Jarvis noted Previous TUR sites noted on left and right lateral wall. Similar in appearance from time of negative TURBT in July 2018.  Cytology remained negative.  She completed maintenance BCG 06/2017 x 3.  She does report today that she is had increased urinary urgency frequency with nocturia.   Blood pressure (!) 150/69, pulse 76, height 5\' 4"  (1.626 m), weight 151 lb (68.5 kg). NED. A&Ox3.   No respiratory distress   Abd soft, NT, ND Normal external genitalia with patent urethral meatus  Cystoscopy Procedure Note  Patient identification was confirmed, informed consent was obtained, and patient was prepped using Betadine solution.  Lidocaine  jelly was administered per urethral meatus.    Preoperative abx where received prior to procedure.    Procedure: - Flexible cystoscope introduced, without any difficulty.   - Thorough search of the bladder revealed:    normal urethral meatus    More extensive erythema, a proximal 1 cm involving the left lateral bladder wall with some adherent debris, concerning for possible recurrence.  No papillary change.    no stones    no ulcers     no tumors    no urethral polyps    no trabeculation  - Ureteral orifices were normal in position and appearance.  Post-Procedure: - Patient tolerated the procedure well  Assessment/ Plan:  1. Erythematous bladder mucosa Area of concern appears to be possibly progressing Repeat urine cytology sent today I recommended returning to the operating room for cystoscopy, bilateral retrograde pyelogram for further evaluation We reviewed the risk and benefits of this including risk of bleeding, infection, damage to surrounding structures amongst others. All questions were answered - Urinalysis, Complete - ciprofloxacin (CIPRO) tablet 500 mg - lidocaine (XYLOCAINE) 2 % jelly 1 application - CULTURE, URINE COMPREHENSIVE  2. History of bladder cancer As above  Return for schedule surgery.   - Urinalysis, Complete - ciprofloxacin (CIPRO) tablet 500 mg - lidocaine (XYLOCAINE) 2 % jelly 1 application   Kim Espy, MD

## 2018-03-27 NOTE — H&P (View-Only) (Signed)
   03/27/18  CC:  Chief Complaint  Patient presents with  . Cysto    HPI: 77 year old female who presents today for surveillance cystoscopy.  Ms. Stailey was found to have 2 small tumors on the right lateral wall just lateral to the ureteral orifice. Each tumor was approximately 1 cm in size, on a narrow stalk, low-grade appearing during a cystoscopy performed on 06/27/2016 with Dr. Louis Meckel.    She underwent TURBT on 07/11/2016 with Dr. Erlene Quan.  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 It was indeterminate if there was muscularispropria in the specimen.  Repeated TURBT was performed on 08/30/2016 with Dr. Pilar Jarvis.  Pathology was negative for residual carcinoma and muscularis propria was present.   She completed a 6 weeks induction course of BCG on 12/07/2016.    She underwent a surveillance cystoscopy with Dr. Pilar Jarvis on 03/01/2017 and a necrotic lesion on the right lateral wallapparent TURBT scar. She also has an erythematous 2-3 cm lesion on the left upper lateral wall concerning for a new neoplasm. There are various erythematous patches at the bladder that arepossibly CIS. TURBT was completed on 03/14/2017 with Dr. Pilar Jarvis.  Pathology was benign.    Surveillance cystoscopy completed on 06/21/2017 with Dr. Pilar Jarvis noted Previous TUR sites noted on left and right lateral wall. Similar in appearance from time of negative TURBT in July 2018.  Cytology remained negative.  She completed maintenance BCG 06/2017 x 3.  She does report today that she is had increased urinary urgency frequency with nocturia.   Blood pressure (!) 150/69, pulse 76, height 5\' 4"  (1.626 m), weight 151 lb (68.5 kg). NED. A&Ox3.   No respiratory distress   Abd soft, NT, ND Normal external genitalia with patent urethral meatus  Cystoscopy Procedure Note  Patient identification was confirmed, informed consent was obtained, and patient was prepped using Betadine solution.  Lidocaine  jelly was administered per urethral meatus.    Preoperative abx where received prior to procedure.    Procedure: - Flexible cystoscope introduced, without any difficulty.   - Thorough search of the bladder revealed:    normal urethral meatus    More extensive erythema, a proximal 1 cm involving the left lateral bladder wall with some adherent debris, concerning for possible recurrence.  No papillary change.    no stones    no ulcers     no tumors    no urethral polyps    no trabeculation  - Ureteral orifices were normal in position and appearance.  Post-Procedure: - Patient tolerated the procedure well  Assessment/ Plan:  1. Erythematous bladder mucosa Area of concern appears to be possibly progressing Repeat urine cytology sent today I recommended returning to the operating room for cystoscopy, bilateral retrograde pyelogram for further evaluation We reviewed the risk and benefits of this including risk of bleeding, infection, damage to surrounding structures amongst others. All questions were answered - Urinalysis, Complete - ciprofloxacin (CIPRO) tablet 500 mg - lidocaine (XYLOCAINE) 2 % jelly 1 application - CULTURE, URINE COMPREHENSIVE  2. History of bladder cancer As above  Return for schedule surgery.   - Urinalysis, Complete - ciprofloxacin (CIPRO) tablet 500 mg - lidocaine (XYLOCAINE) 2 % jelly 1 application   [name], MD

## 2018-03-28 ENCOUNTER — Other Ambulatory Visit: Payer: Self-pay | Admitting: Radiology

## 2018-03-28 DIAGNOSIS — N329 Bladder disorder, unspecified: Secondary | ICD-10-CM

## 2018-03-28 DIAGNOSIS — D494 Neoplasm of unspecified behavior of bladder: Secondary | ICD-10-CM

## 2018-03-29 LAB — CULTURE, URINE COMPREHENSIVE

## 2018-04-01 ENCOUNTER — Other Ambulatory Visit: Payer: Self-pay | Admitting: Urology

## 2018-04-09 ENCOUNTER — Other Ambulatory Visit: Payer: Self-pay

## 2018-04-09 ENCOUNTER — Encounter
Admission: RE | Admit: 2018-04-09 | Discharge: 2018-04-09 | Disposition: A | Discharge status: Home / Self Care | Payer: Medicare Other | Source: Ambulatory Visit | Attending: Urology | Admitting: Urology

## 2018-04-09 ENCOUNTER — Other Ambulatory Visit: Payer: Self-pay | Admitting: Radiology

## 2018-04-09 DIAGNOSIS — F419 Anxiety disorder, unspecified: Secondary | ICD-10-CM | POA: Insufficient documentation

## 2018-04-09 DIAGNOSIS — Z01812 Encounter for preprocedural laboratory examination: Secondary | ICD-10-CM | POA: Insufficient documentation

## 2018-04-09 DIAGNOSIS — I10 Essential (primary) hypertension: Secondary | ICD-10-CM | POA: Diagnosis not present

## 2018-04-09 DIAGNOSIS — E785 Hyperlipidemia, unspecified: Secondary | ICD-10-CM | POA: Diagnosis not present

## 2018-04-09 LAB — BASIC METABOLIC PANEL
ANION GAP: 6 (ref 5–15)
BUN: 12 mg/dL (ref 8–23)
CO2: 28 mmol/L (ref 22–32)
Calcium: 9.5 mg/dL (ref 8.9–10.3)
Chloride: 106 mmol/L (ref 98–111)
Creatinine, Ser: 0.64 mg/dL (ref 0.44–1.00)
GFR calc Af Amer: >60 mL/min (ref >60)
GFR calc non Af Amer: >60 mL/min (ref >60)
GLUCOSE: 92 mg/dL (ref 70–99)
POTASSIUM: 4.1 mmol/L (ref 3.5–5.1)
Sodium: 140 mmol/L (ref 135–145)

## 2018-04-09 LAB — CBC
HEMATOCRIT: 42.6 % (ref 35.0–47.0)
Hemoglobin: 14.6 g/dL (ref 12.0–16.0)
MCH: 31.8 pg (ref 26.0–34.0)
MCHC: 34.2 g/dL (ref 32.0–36.0)
MCV: 93.1 fL (ref 80.0–100.0)
Platelets: 219 10*3/uL (ref 150–440)
RBC: 4.58 MIL/uL (ref 3.80–5.20)
RDW: 14.1 % (ref 11.5–14.5)
WBC: 8 10*3/uL (ref 3.6–11.0)

## 2018-04-09 NOTE — Patient Instructions (Signed)
Your procedure is scheduled on: Wednesday, April 17, 2018  Report to [facility]   DO NOT REPORT TO THE FIRST FLOOR TO REGISTER  To find out your arrival time please call [phone] between 1PM - 3PM on Tuesday, April 16, 2018  Remember: Instructions that are not followed completely may result in serious medical risk,  up to and including death, or upon the discretion of your surgeon and anesthesiologist your  surgery may need to be rescheduled.     _X__ 1. Do not eat food after midnight the night before your procedure.                 No gum chewing or hard candies.ABSOLUTELY NOTHING SOLID AFTER MIDNIGHT                  You may drink clear liquids up to 2 hours before you are scheduled to arrive for your surgery-                   DO not drink clear liquids within 2 hours of the start of your surgery.                  Clear Liquids include:  water, apple juice without pulp, clear carbohydrate                 drink such as Clearfast of Gatorade, Black Coffee or Tea (Do not add                 anything to coffee or tea). NO DAIRY PRODUCTS / HONEY / LEMON  __X__2.  On the morning of surgery brush your teeth with toothpaste and water,                     You may rinse your mouth with mouthwash if you wish.                           Do not swallow any toothpaste of mouthwash.     _X__ 3.  No Alcohol for 24 hours before or after surgery.   _X__ 4.  Do Not Smoke or use e-cigarettes For 24 Hours Prior to Your Surgery.                 Do not use any chewable tobacco products for at least 6 hours prior to                 surgery.  ____  5.  Bring all medications with you on the day of surgery if instructed.   ____  6.  Notify your doctor if there is any change in your medical condition      (cold, fever, infections).     Do not wear jewelry, make-up, hairpins, clips or nail polish. Do not wear lotions, powders, or perfumes. You may  wear deodorant. Do not shave 48 hours prior to surgery. Men may shave face and neck. Do not bring valuables to the hospital.    [facility] is not responsible for any belongings or valuables.  Contacts, dentures or bridgework may not be worn into surgery. Leave your suitcase in the car. After surgery it may be brought to your room. For patients admitted to the hospital, discharge time is determined by your treatment team.   Patients discharged the day of surgery will not be allowed to drive home.  Please read over the following fact sheets that you were given:                             PREPARING FOR SURGERY     ____ Take these medicines the morning of surgery with A SIP OF WATER:    1.XANAX              2. AMLODIPINE  3. ATENOLOL  4.  5.  6.  ____ Fleet Enema (as directed)   __X__ Use ANTIBACTERIAL SOAP TO SHOWER WITH ON THE DAY OF SURGERY    _X___ Stop ASPIRIN AS OF TODAY  __X_ Stop Anti-inflammatories AS OF TODAY              THAT INCLUDES NAPROXEN / ALEVE /ADVIL / IBUPROFEN / MOTRIN    __X__ Stop supplements until after surgery.                THIS INCLUDES B COMPLEX / FLAX SEED / FOLIC ACID / VITAMIN B6 AND VITAMIN B12  ____ Bring C-Pap to the hospital.   TYLENOL IS OKAY TO USE AT ANY TIME

## 2018-04-16 MED ORDER — CEFAZOLIN SODIUM-DEXTROSE 2-4 GM/100ML-% IV SOLN
2.0000 g | INTRAVENOUS | Status: AC
  Administered 2018-04-17: 2 g via INTRAVENOUS

## 2018-04-17 ENCOUNTER — Encounter
Admission: RE | Disposition: A | Discharge status: Home / Self Care | Payer: Self-pay | Source: Ambulatory Visit | Attending: Urology

## 2018-04-17 ENCOUNTER — Ambulatory Visit
Admission: RE | Admit: 2018-04-17 | Discharge: 2018-04-17 | Disposition: A | Discharge status: Home / Self Care | Payer: Medicare Other | Source: Ambulatory Visit | Attending: Urology | Admitting: Urology

## 2018-04-17 ENCOUNTER — Ambulatory Visit: Payer: Medicare Other | Admitting: Certified Registered"

## 2018-04-17 DIAGNOSIS — Z8551 Personal history of malignant neoplasm of bladder: Secondary | ICD-10-CM | POA: Insufficient documentation

## 2018-04-17 DIAGNOSIS — N3289 Other specified disorders of bladder: Secondary | ICD-10-CM | POA: Insufficient documentation

## 2018-04-17 DIAGNOSIS — N329 Bladder disorder, unspecified: Secondary | ICD-10-CM

## 2018-04-17 DIAGNOSIS — N3030 Trigonitis without hematuria: Secondary | ICD-10-CM | POA: Diagnosis not present

## 2018-04-17 DIAGNOSIS — Z79899 Other long term (current) drug therapy: Secondary | ICD-10-CM | POA: Diagnosis not present

## 2018-04-17 DIAGNOSIS — I739 Peripheral vascular disease, unspecified: Secondary | ICD-10-CM | POA: Diagnosis not present

## 2018-04-17 DIAGNOSIS — Z87891 Personal history of nicotine dependence: Secondary | ICD-10-CM | POA: Diagnosis not present

## 2018-04-17 DIAGNOSIS — Z7982 Long term (current) use of aspirin: Secondary | ICD-10-CM | POA: Insufficient documentation

## 2018-04-17 DIAGNOSIS — M797 Fibromyalgia: Secondary | ICD-10-CM | POA: Insufficient documentation

## 2018-04-17 DIAGNOSIS — I10 Essential (primary) hypertension: Secondary | ICD-10-CM | POA: Diagnosis not present

## 2018-04-17 HISTORY — PX: CYSTOSCOPY WITH BIOPSY: SHX5122

## 2018-04-17 HISTORY — PX: CYSTOSCOPY W/ RETROGRADES: SHX1426

## 2018-04-17 SURGERY — CYSTOSCOPY, WITH BIOPSY
Anesthesia: General | Site: Ureter | Wound class: Clean Contaminated

## 2018-04-17 MED ORDER — IOTHALAMATE MEGLUMINE 43 % IV SOLN
INTRAVENOUS | Status: DC | PRN
  Administered 2018-04-17: 15 mL via URETHRAL

## 2018-04-17 MED ORDER — FAMOTIDINE 20 MG PO TABS
20.0000 mg | ORAL_TABLET | Freq: Once | ORAL | Status: AC
  Administered 2018-04-17: 20 mg via ORAL

## 2018-04-17 MED ORDER — ONDANSETRON HCL 4 MG/2ML IJ SOLN
INTRAMUSCULAR | Status: AC
  Filled 2018-04-17: qty 2

## 2018-04-17 MED ORDER — LIDOCAINE HCL (CARDIAC) PF 100 MG/5ML IV SOSY
PREFILLED_SYRINGE | INTRAVENOUS | Status: DC | PRN
  Administered 2018-04-17: 80 mg via INTRAVENOUS

## 2018-04-17 MED ORDER — FENTANYL CITRATE (PF) 100 MCG/2ML IJ SOLN
INTRAMUSCULAR | Status: AC
  Filled 2018-04-17: qty 2

## 2018-04-17 MED ORDER — FENTANYL CITRATE (PF) 100 MCG/2ML IJ SOLN: 25.0000 ug | INTRAMUSCULAR | Status: DC | PRN

## 2018-04-17 MED ORDER — PHENYLEPHRINE HCL 10 MG/ML IJ SOLN
INTRAMUSCULAR | Status: DC | PRN
  Administered 2018-04-17 (×2): 50 ug via INTRAVENOUS

## 2018-04-17 MED ORDER — ONDANSETRON HCL 4 MG/2ML IJ SOLN
INTRAMUSCULAR | Status: DC | PRN
  Administered 2018-04-17: 4 mg via INTRAVENOUS

## 2018-04-17 MED ORDER — CEFAZOLIN SODIUM-DEXTROSE 2-4 GM/100ML-% IV SOLN
INTRAVENOUS | Status: AC
  Filled 2018-04-17: qty 100

## 2018-04-17 MED ORDER — LACTATED RINGERS IV SOLN
INTRAVENOUS | Status: DC
  Administered 2018-04-17: 13:00:00 via INTRAVENOUS

## 2018-04-17 MED ORDER — PROPOFOL 10 MG/ML IV BOLUS
INTRAVENOUS | Status: DC | PRN
  Administered 2018-04-17: 150 mg via INTRAVENOUS
  Administered 2018-04-17: 20 mg via INTRAVENOUS

## 2018-04-17 MED ORDER — OXYCODONE HCL 5 MG/5ML PO SOLN: 5.0000 mg | Freq: Once | ORAL | Status: DC | PRN

## 2018-04-17 MED ORDER — LIDOCAINE HCL (PF) 2 % IJ SOLN
INTRAMUSCULAR | Status: AC
  Filled 2018-04-17: qty 10

## 2018-04-17 MED ORDER — FENTANYL CITRATE (PF) 100 MCG/2ML IJ SOLN
INTRAMUSCULAR | Status: DC | PRN
  Administered 2018-04-17 (×2): 25 ug via INTRAVENOUS
  Administered 2018-04-17: 50 ug via INTRAVENOUS

## 2018-04-17 MED ORDER — OXYCODONE HCL 5 MG PO TABS: 5.0000 mg | ORAL_TABLET | Freq: Once | ORAL | Status: DC | PRN

## 2018-04-17 MED ORDER — FAMOTIDINE 20 MG PO TABS
ORAL_TABLET | ORAL | Status: AC
  Administered 2018-04-17: 20 mg via ORAL
  Filled 2018-04-17: qty 1

## 2018-04-17 MED ORDER — PROPOFOL 10 MG/ML IV BOLUS
INTRAVENOUS | Status: AC
  Filled 2018-04-17: qty 20

## 2018-04-17 SURGICAL SUPPLY — 26 items
BAG DRAIN CYSTO-URO LG1000N (MISCELLANEOUS) ×4 IMPLANT
BRUSH SCRUB EZ  4% CHG (MISCELLANEOUS) ×2
BRUSH SCRUB EZ 4% CHG (MISCELLANEOUS) ×2 IMPLANT
CATH URETL 5X70 OPEN END (CATHETERS) ×4 IMPLANT
CONRAY 43 FOR UROLOGY 50M (MISCELLANEOUS) ×4 IMPLANT
DRAPE UTILITY 15X26 TOWEL STRL (DRAPES) ×4 IMPLANT
DRSG TELFA 4X3 1S NADH ST (GAUZE/BANDAGES/DRESSINGS) ×4 IMPLANT
ELECT REM PT RETURN 9FT ADLT (ELECTROSURGICAL) ×4
ELECTRODE REM PT RTRN 9FT ADLT (ELECTROSURGICAL) ×2 IMPLANT
GLOVE BIO SURGEON STRL SZ 6.5 (GLOVE) ×3 IMPLANT
GLOVE BIO SURGEONS STRL SZ 6.5 (GLOVE) ×1
GOWN STRL REUS W/ TWL LRG LVL3 (GOWN DISPOSABLE) ×4 IMPLANT
GOWN STRL REUS W/TWL LRG LVL3 (GOWN DISPOSABLE) ×8
KIT TURNOVER CYSTO (KITS) ×4 IMPLANT
NDL SAFETY ECLIPSE 18X1.5 (NEEDLE) ×2 IMPLANT
NEEDLE HYPO 18GX1.5 SHARP (NEEDLE)
PACK CYSTO AR (MISCELLANEOUS) ×4 IMPLANT
SENSORWIRE 0.038 NOT ANGLED (WIRE)
SET CYSTO W/LG BORE CLAMP LF (SET/KITS/TRAYS/PACK) ×4 IMPLANT
SOL .9 NS 3000ML IRR  AL (IV SOLUTION) ×2
SOL .9 NS 3000ML IRR AL (IV SOLUTION) ×2
SOL .9 NS 3000ML IRR UROMATIC (IV SOLUTION) ×2 IMPLANT
SURGILUBE 2OZ TUBE FLIPTOP (MISCELLANEOUS) ×4 IMPLANT
WATER STERILE IRR 1000ML POUR (IV SOLUTION) ×4 IMPLANT
WATER STERILE IRR 3000ML UROMA (IV SOLUTION) ×4 IMPLANT
WIRE SENSOR 0.038 NOT ANGLED (WIRE) ×2 IMPLANT

## 2018-04-17 NOTE — Anesthesia Procedure Notes (Signed)
Procedure Name: LMA Insertion Performed by: Lance Muss, CRNA Pre-anesthesia Checklist: Patient identified, Patient being monitored, Timeout performed, Emergency Drugs available and Suction available Patient Re-evaluated:Patient Re-evaluated prior to induction Oxygen Delivery Method: Circle system utilized Preoxygenation: Pre-oxygenation with 100% oxygen Induction Type: IV induction LMA: LMA inserted LMA Size: 3.0 Tube type: Oral Number of attempts: 2 Placement Confirmation: positive ETCO2 and breath sounds checked- equal and bilateral Tube secured with: Tape Dental Injury: Teeth and Oropharynx as per pre-operative assessment  Comments: First attempt with LMA 3.5, was not able to seat. Second attempt with LMA 3, +BBS, +ETCO2.

## 2018-04-17 NOTE — Transfer of Care (Signed)
Immediate Anesthesia Transfer of Care Note  Patient: Kim Espinoza  Procedure(s) Performed: CYSTOSCOPY WITH Bladder BIOPSY (N/A Bladder) CYSTOSCOPY WITH RETROGRADE PYELOGRAM (Bilateral Ureter)  Patient Location: PACU  Anesthesia Type:General  Level of Consciousness: sedated  Airway & Oxygen Therapy: Patient Spontanous Breathing and Patient connected to face mask oxygen  Post-op Assessment: Report given to RN and Post -op Vital signs reviewed and stable  Post vital signs: Reviewed and stable  Last Vitals:  Vitals Value Taken Time  BP 148/66 04/17/2018  3:28 PM  Temp 36.9 C 04/17/2018  3:28 PM  Pulse 67 04/17/2018  3:28 PM  Resp 9 04/17/2018  3:28 PM  SpO2 99 % 04/17/2018  3:28 PM    Last Pain:  Vitals:   04/17/18 1243  TempSrc: Tympanic  PainSc: 0-No pain         Complications: No apparent anesthesia complications

## 2018-04-17 NOTE — Discharge Instructions (Addendum)
Transurethral Resection of Bladder Tumor (TURBT) or Bladder Biopsy   Definition:  Transurethral Resection of the Bladder Tumor is a surgical procedure used to diagnose and remove tumors within the bladder. TURBT is the most common treatment for early stage bladder cancer.  General instructions:     Your recent bladder surgery requires very little post hospital care but some definite precautions.  Despite the fact that no skin incisions were used, the area around the bladder incisions are raw and covered with scabs to promote healing and prevent bleeding. Certain precautions are needed to insure that the scabs are not disturbed over the next 2-4 weeks while the healing proceeds.  Because the raw surface inside your bladder and the irritating effects of urine you may expect frequency of urination and/or urgency (a stronger desire to urinate) and perhaps even getting up at night more often. This will usually resolve or improve slowly over the healing period. You may see some blood in your urine over the first 6 weeks. Do not be alarmed, even if the urine was clear for a while. Get off your feet and drink lots of fluids until clearing occurs. If you start to pass clots or don't improve call us.  Diet:  You may return to your normal diet immediately. Because of the raw surface of your bladder, alcohol, spicy foods, foods high in acid and drinks with caffeine may cause irritation or frequency and should be used in moderation. To keep your urine flowing freely and avoid constipation, drink plenty of fluids during the day (8-10 glasses). Tip: Avoid cranberry juice because it is very acidic.  Activity:  Your physical activity doesn't need to be restricted. However, if you are very active, you may see some blood in the urine. We suggest that you reduce your activity under the circumstances until the bleeding has stopped.  Bowels:  It is important to keep your bowels regular during the postoperative  period. Straining with bowel movements can cause bleeding. A bowel movement every other day is reasonable. Use a mild laxative if needed, such as milk of magnesia 2-3 tablespoons, or 2 Dulcolax tablets. Call if you continue to have problems. If you had been taking narcotics for pain, before, during or after your surgery, you may be constipated. Take a laxative if necessary.    Medication:  You should resume your pre-surgery medications unless told not to. In addition you may be given an antibiotic to prevent or treat infection. Antibiotics are not always necessary. All medication should be taken as prescribed until the bottles are finished unless you are having an unusual reaction to one of the drugs.   [facility] [address] [place], [place] [address] [phone]    AMBULATORY SURGERY  DISCHARGE INSTRUCTIONS   1) The drugs that you were given will stay in your system until tomorrow so for the next 24 hours you should not:  A) Drive an automobile B) Make any legal decisions C) Drink any alcoholic beverage   2) You may resume regular meals tomorrow.  Today it is better to start with liquids and gradually work up to solid foods.  You may eat anything you prefer, but it is better to start with liquids, then soup and crackers, and gradually work up to solid foods.   3) Please notify your doctor immediately if you have any unusual bleeding, trouble breathing, redness and pain at the surgery site, drainage, fever, or pain not relieved by medication.  4) Additional Instructions: ° ° ° ° ° ° ° °Please contact your physician with any problems or Same Day Surgery at [phone], Monday through Friday 6 am to 4 pm, or [facility] number at [phone]. ° ° ° °

## 2018-04-17 NOTE — Anesthesia Preprocedure Evaluation (Addendum)
Anesthesia Evaluation  Patient identified by MRN, date of birth, ID band Patient awake    Reviewed: Allergy & Precautions, H&P , NPO status , Patient's Chart, lab work & pertinent test results  Airway Mallampati: III  TM Distance: >3 FB Neck ROM: full    Dental  (+) Missing   Pulmonary neg pulmonary ROS, neg sleep apnea, neg COPD, former smoker,    breath sounds clear to auscultation       Cardiovascular hypertension, + Peripheral Vascular Disease  (-) Past MI, (-) Cardiac Stents and (-) CABG negative cardio ROS   Rhythm:regular Rate:Normal     Neuro/Psych Anxiety negative neurological ROS  negative psych ROS   GI/Hepatic negative GI ROS, Neg liver ROS,   Endo/Other  negative endocrine ROS  Renal/GU negative Renal ROS  negative genitourinary   Musculoskeletal  (+) Fibromyalgia -  Abdominal   Peds  Hematology negative hematology ROS (+)   Anesthesia Other Findings Past Medical History: 2011, 2012: Abnormal mammogram     Comment:  Right side.  No date: Anxiety No date: Breast screening, unspecified 2018: Cancer ([place])     Comment:  "bladder stage 1" since 2001: Hypercholesterolemia No date: Hypertension     Comment:  since approx. 2001 No date: Mammographic microcalcification No date: Personal history of tobacco use, presenting hazards to health No date: Special screening for malignant neoplasms, colon sine April 2013: Vertigo  Past Surgical History: ??: BREAST BIOPSY; Right     Comment:  x2 - core w/clip - neg 2011: BREAST BIOPSY-right     Comment:  Stereotactic - Dr. Bary Castilla- Benign breast tissue               containing stromal calcifications wiithin.  Periductal               Fibrosis. 2011, 2013: BREAST SURGERY; Right     Comment:  stero biopsy 2004: CAROTID ENDARTERECTOMY 2013: CATARACT EXTRACTION EXTRACAPSULAR     Comment:  Left eye 2012: COLONOSCOPY     Comment:  Dr. Bary Castilla No date: EYE  SURGERY; Left     Comment:  cataract  No date: TONSILLECTOMY 08/30/2016: TRANSURETHRAL RESECTION OF BLADDER TUMOR; N/A     Comment:  Procedure: TRANSURETHRAL RESECTION OF BLADDER TUMOR               (TURBT) SMALL;  Surgeon: [name], MD;                Location: [facility] ORS;  Service: Urology;  Laterality: N/A; 03/14/2017: TRANSURETHRAL RESECTION OF BLADDER TUMOR; N/A     Comment:  Procedure: TRANSURETHRAL RESECTION OF BLADDER TUMOR               (TURBT)-MEDIUM;  Surgeon: [name], MD;                Location: [facility] ORS;  Service: Urology;  Laterality: N/A; 07/11/2016: TRANSURETHRAL RESECTION OF BLADDER TUMOR WITH MITOMYCIN- C; N/A     Comment:  Procedure: TRANSURETHRAL RESECTION OF BLADDER TUMOR WITH              MITOMYCIN-C;  Surgeon: [name], MD;  Location:               [facility] ORS;  Service: Urology;  Laterality: N/A; No date: TUBAL LIGATION 2004: VASCULAR SURGERY; Left     Comment:  carotid endarderectomy     Reproductive/Obstetrics negative OB ROS  Anesthesia Physical Anesthesia Plan  ASA: II  Anesthesia Plan: General   Post-op Pain Management:    Induction:   PONV Risk Score and Plan: Ondansetron and Dexamethasone  Airway Management Planned: LMA  Additional Equipment:   Intra-op Plan:   Post-operative Plan:   Informed Consent: I have reviewed the patients History and Physical, chart, labs and discussed the procedure including the risks, benefits and alternatives for the proposed anesthesia with the patient or authorized representative who has indicated his/her understanding and acceptance.   Dental Advisory Given  Plan Discussed with: Anesthesiologist, CRNA and Surgeon  Anesthesia Plan Comments:         Anesthesia Quick Evaluation

## 2018-04-17 NOTE — Anesthesia Post-op Follow-up Note (Signed)
Anesthesia QCDR form completed.        

## 2018-04-17 NOTE — Anesthesia Postprocedure Evaluation (Signed)
Anesthesia Post Note  Patient: [name]  Procedure(s) Performed: CYSTOSCOPY WITH Bladder BIOPSY (N/A Bladder) CYSTOSCOPY WITH RETROGRADE PYELOGRAM (Bilateral Ureter)  Patient location during evaluation: PACU Anesthesia Type: General Level of consciousness: awake and alert Pain management: pain level controlled Vital Signs Assessment: post-procedure vital signs reviewed and stable Respiratory status: spontaneous breathing, nonlabored ventilation and respiratory function stable Cardiovascular status: blood pressure returned to baseline and stable Postop Assessment: no apparent nausea or vomiting Anesthetic complications: no     Last Vitals:  Vitals:   04/17/18 1543 04/17/18 1558  BP: (!) 144/64 (!) 104/42  Pulse: (!) 58 (!) 56  Resp: 15 14  Temp:    SpO2: 100% 93%    Last Pain:  Vitals:   04/17/18 1558  TempSrc:   PainSc: 0-No pain                 Durenda Hurt

## 2018-04-17 NOTE — Interval H&P Note (Signed)
History and Physical Interval Note:  04/17/2018 2:11 PM  Kim Espinoza  has presented today for surgery, with the diagnosis of bladder lesion  The various methods of treatment have been discussed with the patient and family. After consideration of risks, benefits and other options for treatment, the patient has consented to  Procedure(s): CYSTOSCOPY WITH Bladder BIOPSY (N/A) CYSTOSCOPY WITH RETROGRADE PYELOGRAM (Bilateral) as a surgical intervention .  The patient's history has been reviewed, patient examined, no change in status, stable for surgery.  I have reviewed the patient's chart and labs.  Questions were answered to the patient's satisfaction.    [facility] CTAB  [name]

## 2018-04-17 NOTE — Op Note (Signed)
Date of procedure: 04/17/18  Preoperative diagnosis:  1. History of bladder cancer 2. Bladder lesion/erythema  Postoperative diagnosis:  1. Same as above  Procedure: 1. Cystoscopy 2. Bilateral retrograde pyelogram 3. Bladder biopsy  Surgeon: [name], MD  Anesthesia: General  Complications: None  Intraoperative findings: Bladder erythema on posterior bladder wall,?  Scope trauma.  1 cm erythema on the left lateral bladder wall with some adherent debris.  Normal bilateral retrograde pyelogram.  EBL: Minimal  Specimens: Posterior bladder wall biopsy, lateral bladder wall biopsy  Drains: None  Indication: Kim Espinoza is a 77 y.o. patient with personal history of bladder cancer found to have a erythematous patch on her left lateral bladder wall on surveillance cystoscopy.  After reviewing the management options for treatment, she elected to proceed with the above surgical procedure(s). We have discussed the potential benefits and risks of the procedure, side effects of the proposed treatment, the likelihood of the patient achieving the goals of the procedure, and any potential problems that might occur during the procedure or recuperation. Informed consent has been obtained.  Description of procedure:  The patient was taken to the operating room and general anesthesia was induced.  The patient was placed in the dorsal lithotomy position, prepped and draped in the usual sterile fashion, and preoperative antibiotics were administered. A preoperative time-out was performed.   A 21 French scope was advanced per urethra into the bladder.  The bladder was carefully inspected.  There is some mild erythema the posterior bladder wall is unclear whether this represented a true lesion or if this was secondary to scope trauma.  Additionally to this, there was approximately 1 cm area on the left lateral bladder wall with some overlying adherent necrotic debris.  This is suspicious for  recurrence versus CIS.  The remainder of the bladder was unremarkable.  Attention was turned to the left ureteral orifice was cannulated using a 5 [place] open-ended ureteral catheter.  Gentle retrograde pyelogram on this side revealed a delicate appearing ureter with a normal collecting system and no hydroureteronephrosis.  Right-sided retrograde pyelogram was also unremarkable with a slightly bifid appearing collecting system but no ureteral duplication.  There is no hydronephrosis or filling defects on the side either.  Next, cold cup biopsy forceps were used to biopsy the posterior bladder wall which was sent as a separate specimen as well as the left lateral bladder wall.  Each of these areas were fulgurated using Bugbee electrocautery for hemostasis.  Notably, the bladder itself was diffusely oozing but ultimately hemostasis was achieved.  The bladder was then drained.  Patient was then clean and dry, repositioned in supine position, reversed from anesthesia, taken to PACU in stable condition.  Plan: I will call the patient with her pathology results.  We will plan on cystoscopy in 3 months unless otherwise indicated based on her pathology results.  [name], M.D.

## 2018-04-18 ENCOUNTER — Encounter: Payer: Self-pay | Admitting: Urology

## 2018-04-19 LAB — SURGICAL PATHOLOGY

## 2018-07-16 ENCOUNTER — Other Ambulatory Visit: Payer: Self-pay | Admitting: Family Medicine

## 2018-07-16 DIAGNOSIS — Z1231 Encounter for screening mammogram for malignant neoplasm of breast: Secondary | ICD-10-CM

## 2018-07-23 ENCOUNTER — Ambulatory Visit: Payer: Medicare Other | Admitting: Urology

## 2018-07-23 VITALS — BP 157/75 | HR 74 | Ht 64.0 in | Wt 151.2 lb

## 2018-07-23 DIAGNOSIS — N329 Bladder disorder, unspecified: Secondary | ICD-10-CM

## 2018-07-23 DIAGNOSIS — Z8551 Personal history of malignant neoplasm of bladder: Secondary | ICD-10-CM

## 2018-07-23 LAB — MICROSCOPIC EXAMINATION

## 2018-07-23 LAB — URINALYSIS, COMPLETE
BILIRUBIN UA: NEGATIVE
Glucose, UA: NEGATIVE
Ketones, UA: NEGATIVE
Nitrite, UA: NEGATIVE
SPEC GRAV UA: 1.025 (ref 1.005–1.030)
Urobilinogen, Ur: 1 mg/dL (ref 0.2–1.0)
pH, UA: 5 (ref 5.0–7.5)

## 2018-07-23 NOTE — Progress Notes (Signed)
   07/23/18  CC:  Chief Complaint  Patient presents with  . Cysto    HPI: 77 year old female who presents today for surveillance cystoscopy.  Ms. Danish was found to have 2 small tumors on the right lateral wall just lateral to the ureteral orifice. Each tumor was approximately 1 cm in size, on a narrow stalk, low-grade appearing during a cystoscopy performed on 06/27/2016 with Dr. Louis Meckel.    She underwent TURBT on 07/11/2016 with Dr. Erlene Quan.  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 It was indeterminate if there was muscularispropria in the specimen.  Repeated TURBT was performed on 08/30/2016 with Dr. Pilar Jarvis.  Pathology was negative for residual carcinoma and muscularis propria was present.   She completed a 6 weeks induction course of BCG on 12/07/2016.    She underwent a surveillance cystoscopy with Dr. Pilar Jarvis on 03/01/2017 and a necrotic lesion on the right lateral wallapparent TURBT scar. She also has an erythematous 2-3 cm lesion on the left upper lateral wall concerning for a new neoplasm. There are various erythematous patches at the bladder that arepossibly CIS. TURBT was completed on 03/14/2017 with Dr. Pilar Jarvis.  Pathology was benign.    Surveillance cystoscopy completed on 06/21/2017 with Dr. Pilar Jarvis noted Previous TUR sites noted on left and right lateral wall. Similar in appearance from time of negative TURBT in July 2018.  Cytology remained negative.  She completed maintenance BCG 06/2017 x 3.  Most recently, she returned to the operating room on 03/2018 with recurrent erythema on the left lateral bladder wall with some adherent debris.   Surgical pathology consistent with inflamed and focally ulcerative urothelial carcinoma with focal dystrophic calcification negative for malignancy and atypia.  She also had an area c/w squamous metaplasia.  Bilateral retrograde pyelograms were unremarkable.   Blood pressure (!) 150/69, pulse 76, height 5'  4" (1.626 m), weight 151 lb (68.5 kg). NED. A&Ox3.   No respiratory distress   Abd soft, NT, ND Normal external genitalia with patent urethral meatus  Cystoscopy Procedure Note  Patient identification was confirmed, informed consent was obtained, and patient was prepped using Betadine solution.  Lidocaine jelly was administered per urethral meatus.    Preoperative abx where received prior to procedure.    Procedure: - Flexible cystoscope introduced, without any difficulty.   - Thorough search of the bladder revealed:    normal urethral meatus    Dystrophic calcifications seen in previous biopsy sites including the posterior and left lateral bladder wall with slight surrounding erythema    no stones    no ulcers     no tumors    no urethral polyps    no trabeculation  - Ureteral orifices were normal in position and appearance.  Post-Procedure: - Patient tolerated the procedure well  Assessment/ Plan:  1. Erythematous bladder mucosa Previously biopsied areas consistent with dystrophic calcification Surgical pathology was reviewed again today, reassuring for no underlying malignancy We will continue to follow these areas cystoscopically Repeat cystoscopy in 3 months - Urinalysis, Complete - ciprofloxacin (CIPRO) tablet 500 mg - lidocaine (XYLOCAINE) 2 % jelly 1 application - CULTURE, URINE COMPREHENSIVE  2. History of bladder cancer As above  Return in about 3 months (around 10/22/2018) for cysto.   - Urinalysis, Complete - ciprofloxacin (CIPRO) tablet 500 mg - lidocaine (XYLOCAINE) 2 % jelly 1 application   [name], MD

## 2018-10-07 ENCOUNTER — Ambulatory Visit
Admission: RE | Admit: 2018-10-07 | Discharge: 2018-10-07 | Disposition: A | Discharge status: Home / Self Care | Payer: Medicare Other | Source: Ambulatory Visit | Attending: Family Medicine | Admitting: Family Medicine

## 2018-10-07 DIAGNOSIS — Z1231 Encounter for screening mammogram for malignant neoplasm of breast: Secondary | ICD-10-CM

## 2018-10-14 ENCOUNTER — Encounter: Payer: Self-pay | Admitting: *Deleted

## 2018-10-14 ENCOUNTER — Other Ambulatory Visit: Payer: Self-pay

## 2018-10-22 ENCOUNTER — Ambulatory Visit: Payer: Medicare Other | Admitting: Urology

## 2018-10-22 DIAGNOSIS — N3000 Acute cystitis without hematuria: Secondary | ICD-10-CM | POA: Diagnosis not present

## 2018-10-22 DIAGNOSIS — Z8551 Personal history of malignant neoplasm of bladder: Secondary | ICD-10-CM

## 2018-10-22 LAB — MICROSCOPIC EXAMINATION

## 2018-10-22 LAB — URINALYSIS, COMPLETE
BILIRUBIN UA: NEGATIVE
Glucose, UA: NEGATIVE
Nitrite, UA: NEGATIVE
PH UA: 5 (ref 5.0–7.5)
SPEC GRAV UA: 1.025 (ref 1.005–1.030)
UUROB: 1 mg/dL (ref 0.2–1.0)

## 2018-10-22 MED ORDER — SULFAMETHOXAZOLE-TRIMETHOPRIM 800-160 MG PO TABS: 1.0000 | ORAL_TABLET | Freq: Two times a day (BID) | ORAL | 0 refills | Status: DC

## 2018-10-22 NOTE — Addendum Note (Signed)
Addended by: [name] on: 10/22/2018 12:57 PM   Modules accepted: Orders

## 2018-10-22 NOTE — Progress Notes (Signed)
10/22/18  78 year old female with a personal history of bladder cancer who returns today for surveillance cystoscopy.  Unfortunately, she has been experiencing dysuria for few days concerning for possible UTI.  Denies any fevers or chills.  UA today reveals greater than 30 red blood cells, calcium oxalate crystals, many bacteria and yeast present.  Assessment/ Plan   1.  Acute cystitis without hematuria- we will go ahead and treat her today for presumed possible UTI with extreme DS for 7 days  2.  History of bladder cancer-will reschedule surveillance cystoscopy for a few weeks after her infection is adequately treated  [name], MD

## 2018-10-24 LAB — CULTURE, URINE COMPREHENSIVE

## 2018-10-24 NOTE — Discharge Instructions (Signed)

## 2018-10-25 ENCOUNTER — Telehealth: Payer: Self-pay

## 2018-10-25 MED ORDER — NITROFURANTOIN MONOHYD MACRO 100 MG PO CAPS: 100.0000 mg | ORAL_CAPSULE | Freq: Two times a day (BID) | ORAL | 0 refills | Status: AC

## 2018-10-25 NOTE — Telephone Encounter (Signed)
Called and advised patient.  Nitrofurantoin sent to pharmacy. Patient verbalized understanding.

## 2018-10-25 NOTE — Telephone Encounter (Signed)
-----   Message from [name], MD sent at 10/25/2018 11:07 AM EST ----- She grew Enterococcus thus I would like to change her antibiotics to nitrofurantoin 100 mg twice daily for total of 7 days.  [name], MD

## 2018-10-25 NOTE — Anesthesia Preprocedure Evaluation (Addendum)
Anesthesia Evaluation  Patient identified by MRN, date of birth, ID band Patient awake    Reviewed: Allergy & Precautions, NPO status , Patient's Chart, lab work & pertinent test results  History of Anesthesia Complications Negative for: history of anesthetic complications  Airway Mallampati: I   Neck ROM: Full    Dental  (+)    Pulmonary former smoker (quit 2007),    Pulmonary exam normal breath sounds clear to auscultation       Cardiovascular hypertension, + Peripheral Vascular Disease (left carotid stenosis)  Normal cardiovascular exam Rhythm:Regular Rate:Normal     Neuro/Psych PSYCHIATRIC DISORDERS Anxiety Vertigo     GI/Hepatic negative GI ROS,   Endo/Other  negative endocrine ROS  Renal/GU negative Renal ROS     Musculoskeletal  (+) Arthritis , Fibromyalgia -  Abdominal   Peds  Hematology negative hematology ROS (+) Bladder CA   Anesthesia Other Findings   Reproductive/Obstetrics                            Anesthesia Physical Anesthesia Plan  ASA: II  Anesthesia Plan: MAC   Post-op Pain Management:    Induction: Intravenous  PONV Risk Score and Plan: 2 and TIVA and Midazolam  Airway Management Planned: Natural Airway  Additional Equipment:   Intra-op Plan:   Post-operative Plan:   Informed Consent: I have reviewed the patients History and Physical, chart, labs and discussed the procedure including the risks, benefits and alternatives for the proposed anesthesia with the patient or authorized representative who has indicated his/her understanding and acceptance.       Plan Discussed with: CRNA  Anesthesia Plan Comments:        Anesthesia Quick Evaluation

## 2018-10-28 ENCOUNTER — Ambulatory Visit
Admission: RE | Admit: 2018-10-28 | Discharge: 2018-10-28 | Disposition: A | Discharge status: Home / Self Care | Payer: Medicare Other | Attending: Ophthalmology | Admitting: Ophthalmology

## 2018-10-28 ENCOUNTER — Ambulatory Visit: Payer: Medicare Other | Admitting: Anesthesiology

## 2018-10-28 ENCOUNTER — Encounter
Admission: RE | Disposition: A | Discharge status: Home / Self Care | Payer: Self-pay | Source: Home / Self Care | Attending: Ophthalmology

## 2018-10-28 DIAGNOSIS — H2511 Age-related nuclear cataract, right eye: Secondary | ICD-10-CM | POA: Insufficient documentation

## 2018-10-28 DIAGNOSIS — I739 Peripheral vascular disease, unspecified: Secondary | ICD-10-CM | POA: Diagnosis not present

## 2018-10-28 DIAGNOSIS — M1990 Unspecified osteoarthritis, unspecified site: Secondary | ICD-10-CM | POA: Diagnosis not present

## 2018-10-28 DIAGNOSIS — F419 Anxiety disorder, unspecified: Secondary | ICD-10-CM | POA: Diagnosis not present

## 2018-10-28 DIAGNOSIS — I6522 Occlusion and stenosis of left carotid artery: Secondary | ICD-10-CM | POA: Insufficient documentation

## 2018-10-28 DIAGNOSIS — I10 Essential (primary) hypertension: Secondary | ICD-10-CM | POA: Diagnosis not present

## 2018-10-28 DIAGNOSIS — E7800 Pure hypercholesterolemia, unspecified: Secondary | ICD-10-CM | POA: Insufficient documentation

## 2018-10-28 DIAGNOSIS — F17200 Nicotine dependence, unspecified, uncomplicated: Secondary | ICD-10-CM | POA: Diagnosis not present

## 2018-10-28 DIAGNOSIS — R42 Dizziness and giddiness: Secondary | ICD-10-CM | POA: Diagnosis not present

## 2018-10-28 DIAGNOSIS — M797 Fibromyalgia: Secondary | ICD-10-CM | POA: Insufficient documentation

## 2018-10-28 DIAGNOSIS — Z8551 Personal history of malignant neoplasm of bladder: Secondary | ICD-10-CM | POA: Diagnosis not present

## 2018-10-28 HISTORY — DX: Unspecified osteoarthritis, unspecified site: M19.90

## 2018-10-28 HISTORY — PX: CATARACT EXTRACTION W/PHACO: SHX586

## 2018-10-28 SURGERY — PHACOEMULSIFICATION, CATARACT, WITH IOL INSERTION
Anesthesia: Monitor Anesthesia Care | Site: Eye | Laterality: Right

## 2018-10-28 MED ORDER — ACETAMINOPHEN 325 MG PO TABS: 650.0000 mg | ORAL_TABLET | Freq: Once | ORAL | Status: DC | PRN

## 2018-10-28 MED ORDER — FENTANYL CITRATE (PF) 100 MCG/2ML IJ SOLN
INTRAMUSCULAR | Status: DC | PRN
  Administered 2018-10-28: 50 ug via INTRAVENOUS

## 2018-10-28 MED ORDER — SODIUM HYALURONATE 10 MG/ML IO SOLN
INTRAOCULAR | Status: DC | PRN
  Administered 2018-10-28: 0.55 mL via INTRAOCULAR

## 2018-10-28 MED ORDER — MIDAZOLAM HCL 2 MG/2ML IJ SOLN
INTRAMUSCULAR | Status: DC | PRN
  Administered 2018-10-28: 1 mg via INTRAVENOUS

## 2018-10-28 MED ORDER — LACTATED RINGERS IV SOLN: INTRAVENOUS | Status: DC

## 2018-10-28 MED ORDER — LIDOCAINE HCL (PF) 2 % IJ SOLN
INTRAOCULAR | Status: DC | PRN
  Administered 2018-10-28: 2 mL via INTRAOCULAR

## 2018-10-28 MED ORDER — TETRACAINE HCL 0.5 % OP SOLN
1.0000 [drp] | OPHTHALMIC | Status: DC | PRN
  Administered 2018-10-28 (×3): 1 [drp] via OPHTHALMIC

## 2018-10-28 MED ORDER — SODIUM HYALURONATE 23 MG/ML IO SOLN
INTRAOCULAR | Status: DC | PRN
  Administered 2018-10-28: 0.6 mL via INTRAOCULAR

## 2018-10-28 MED ORDER — ACETAMINOPHEN 160 MG/5ML PO SOLN: 325.0000 mg | ORAL | Status: DC | PRN

## 2018-10-28 MED ORDER — ONDANSETRON HCL 4 MG/2ML IJ SOLN: 4.0000 mg | Freq: Once | INTRAMUSCULAR | Status: DC | PRN

## 2018-10-28 MED ORDER — MOXIFLOXACIN HCL 0.5 % OP SOLN
OPHTHALMIC | Status: DC | PRN
  Administered 2018-10-28: 0.2 mL via OPHTHALMIC

## 2018-10-28 MED ORDER — ARMC OPHTHALMIC DILATING DROPS
1.0000 "application " | OPHTHALMIC | Status: DC | PRN
  Administered 2018-10-28 (×2): 1 via OPHTHALMIC

## 2018-10-28 MED ORDER — EPINEPHRINE PF 1 MG/ML IJ SOLN
INTRAOCULAR | Status: DC | PRN
  Administered 2018-10-28: 104 mL via OPHTHALMIC

## 2018-10-28 SURGICAL SUPPLY — 19 items
CANNULA ANT/CHMB 27G (MISCELLANEOUS) ×2 IMPLANT
CANNULA ANT/CHMB 27GA (MISCELLANEOUS) ×6 IMPLANT
DISSECTOR HYDRO NUCLEUS 50X22 (MISCELLANEOUS) ×3 IMPLANT
GLOVE SURG LX 7.5 STRW (GLOVE) ×2
GLOVE SURG LX STRL 7.5 STRW (GLOVE) ×1 IMPLANT
GLOVE SURG SYN 8.5  E (GLOVE) ×2
GLOVE SURG SYN 8.5 E (GLOVE) ×1 IMPLANT
GLOVE SURG SYN 8.5 PF PI (GLOVE) ×1 IMPLANT
GOWN STRL REUS W/ TWL LRG LVL3 (GOWN DISPOSABLE) ×2 IMPLANT
GOWN STRL REUS W/TWL LRG LVL3 (GOWN DISPOSABLE) ×6
LENS IOL TECNIS ITEC 22.5 (Intraocular Lens) ×2 IMPLANT
MARKER SKIN DUAL TIP RULER LAB (MISCELLANEOUS) ×3 IMPLANT
PACK DR. KING ARMS (PACKS) ×3 IMPLANT
PACK EYE AFTER SURG (MISCELLANEOUS) ×3 IMPLANT
PACK OPTHALMIC (MISCELLANEOUS) ×3 IMPLANT
SYR 3ML LL SCALE MARK (SYRINGE) ×3 IMPLANT
SYR TB 1ML LUER SLIP (SYRINGE) ×3 IMPLANT
WATER STERILE IRR 500ML POUR (IV SOLUTION) ×3 IMPLANT
WIPE NON LINTING 3.25X3.25 (MISCELLANEOUS) ×3 IMPLANT

## 2018-10-28 NOTE — Transfer of Care (Signed)
Immediate Anesthesia Transfer of Care Note  Patient: Kim Espinoza  Procedure(s) Performed: CATARACT EXTRACTION PHACO AND INTRAOCULAR LENS PLACEMENT ([facility]) RIGHT (Right Eye)  Patient Location: PACU  Anesthesia Type: MAC  Level of Consciousness: awake, alert  and patient cooperative  Airway and Oxygen Therapy: Patient Spontanous Breathing and Patient connected to supplemental oxygen  Post-op Assessment: Post-op Vital signs reviewed, Patient's Cardiovascular Status Stable, Respiratory Function Stable, Patent Airway and No signs of Nausea or vomiting  Post-op Vital Signs: Reviewed and stable  Complications: No apparent anesthesia complications

## 2018-10-28 NOTE — Anesthesia Postprocedure Evaluation (Signed)
Anesthesia Post Note  Patient: Kim Espinoza  Procedure(s) Performed: CATARACT EXTRACTION PHACO AND INTRAOCULAR LENS PLACEMENT ([place]) RIGHT (Right Eye)  Patient location during evaluation: PACU Anesthesia Type: MAC Level of consciousness: awake and alert, oriented and patient cooperative Pain management: pain level controlled Vital Signs Assessment: post-procedure vital signs reviewed and stable Respiratory status: spontaneous breathing, nonlabored ventilation and respiratory function stable Cardiovascular status: blood pressure returned to baseline and stable Postop Assessment: adequate PO intake Anesthetic complications: no    Darrin Nipper

## 2018-10-28 NOTE — Op Note (Signed)
OPERATIVE NOTE  Kim Espinoza [id number] 10/28/2018   PREOPERATIVE DIAGNOSIS:  Nuclear sclerotic cataract right eye.  H25.11   POSTOPERATIVE DIAGNOSIS:    Nuclear sclerotic cataract right eye.     PROCEDURE:  Phacoemusification with posterior chamber intraocular lens placement of the right eye   LENS:   Implant Name Type Inv. Item Serial No. Manufacturer Lot No. LRB No. Used  LENS IOL DIOP 22.5 - [id number] Intraocular Lens LENS IOL DIOP 22.5 [id number]  Right 1       PCB00 +22.5   ULTRASOUND TIME: 1 minutes 05 seconds.  CDE 10.23   SURGEON:  Benay Pillow, MD, MPH  ANESTHESIOLOGIST: Anesthesiologist: Darrin Nipper, MD CRNA: Cameron Ali, CRNA   ANESTHESIA:  Topical with tetracaine drops augmented with 1% preservative-free intracameral lidocaine.  ESTIMATED BLOOD LOSS: less than 1 mL.   COMPLICATIONS:  None.   DESCRIPTION OF PROCEDURE:  The patient was identified in the holding room and transported to the operating room and placed in the supine position under the operating microscope.  The right eye was identified as the operative eye and it was prepped and draped in the usual sterile ophthalmic fashion.   A 1.0 millimeter clear-corneal paracentesis was made at the 10:30 position. 0.5 ml of preservative-free 1% lidocaine with epinephrine was injected into the anterior chamber.  The anterior chamber was filled with Healon 5 viscoelastic.  A 2.4 millimeter keratome was used to make a near-clear corneal incision at the 8:00 position.  A curvilinear capsulorrhexis was made with a cystotome and capsulorrhexis forceps.  Balanced salt solution was used to hydrodissect and hydrodelineate the nucleus.   Phacoemulsification was then used in stop and chop fashion to remove the lens nucleus and epinucleus.  The remaining cortex was then removed using the irrigation and aspiration handpiece. Healon was then placed into the capsular bag to distend it for lens placement.  A lens was then  injected into the capsular bag.  The remaining viscoelastic was aspirated.   Wounds were hydrated with balanced salt solution.  The anterior chamber was inflated to a physiologic pressure with balanced salt solution.   Intracameral vigamox 0.1 mL undiluted was injected into the eye and a drop placed onto the ocular surface.  No wound leaks were noted.  The patient was taken to the recovery room in stable condition without complications of anesthesia or surgery  Benay Pillow 10/28/2018, 12:08 PM

## 2018-10-28 NOTE — Anesthesia Procedure Notes (Signed)
Procedure Name: MAC Date/Time: 10/28/2018 11:46 AM Performed by: Cameron Ali, CRNA Pre-anesthesia Checklist: Patient identified, Emergency Drugs available, Suction available, Timeout performed and Patient being monitored Patient Re-evaluated:Patient Re-evaluated prior to induction Oxygen Delivery Method: Nasal cannula Placement Confirmation: positive ETCO2

## 2018-10-28 NOTE — H&P (Signed)

## 2018-10-29 ENCOUNTER — Encounter: Payer: Self-pay | Admitting: Ophthalmology

## 2018-11-06 ENCOUNTER — Ambulatory Visit: Payer: Medicare Other | Admitting: Urology

## 2018-11-06 ENCOUNTER — Encounter: Payer: Self-pay | Admitting: Urology

## 2018-11-06 ENCOUNTER — Other Ambulatory Visit: Payer: Self-pay

## 2018-11-06 VITALS — BP 148/78 | HR 75 | Ht 64.0 in | Wt 148.0 lb

## 2018-11-06 DIAGNOSIS — Z8551 Personal history of malignant neoplasm of bladder: Secondary | ICD-10-CM | POA: Diagnosis not present

## 2018-11-06 LAB — URINALYSIS, COMPLETE
Bilirubin, UA: NEGATIVE
Glucose, UA: NEGATIVE
Ketones, UA: NEGATIVE
Leukocytes, UA: NEGATIVE
Nitrite, UA: NEGATIVE
PROTEIN UA: NEGATIVE
Specific Gravity, UA: <1.005 — ABNORMAL LOW (ref 1.005–1.030)
Urobilinogen, Ur: 0.2 mg/dL (ref 0.2–1.0)
pH, UA: 6.5 (ref 5.0–7.5)

## 2018-11-06 NOTE — Progress Notes (Signed)
   11/06/18  CC:  Chief Complaint  Patient presents with  . Cysto    HPI: 78 year old female who presents today for surveillance cystoscopy.  Kim Espinoza was found to have 2 small tumors on the right lateral wall just lateral to the ureteral orifice. Each tumor was approximately 1 cm in size, on a narrow stalk, low-grade appearing during a cystoscopy performed on 06/27/2016 with Dr. Louis Meckel.    She underwent TURBT on 07/11/2016 with Dr. Erlene Quan.  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 It was indeterminate if there was muscularispropria in the specimen.  Repeated TURBT was performed on 08/30/2016 with Dr. Pilar Jarvis.  Pathology was negative for residual carcinoma and muscularis propria was present.   She completed a 6 weeks induction course of BCG on 12/07/2016.    She underwent a surveillance cystoscopy with Dr. Pilar Jarvis on 03/01/2017 and a necrotic lesion on the right lateral wallapparent TURBT scar. She also has an erythematous 2-3 cm lesion on the left upper lateral wall concerning for a new neoplasm. There are various erythematous patches at the bladder that arepossibly CIS. TURBT was completed on 03/14/2017 with Dr. Pilar Jarvis.  Pathology was benign.    Surveillance cystoscopy completed on 06/21/2017 with Dr. Pilar Jarvis noted Previous TUR sites noted on left and right lateral wall. Similar in appearance from time of negative TURBT in July 2018.  Cytology remained negative.  She completed maintenance BCG 06/2017 x 3.  Most recently, she returned to the operating room on 03/2018 with recurrent erythema on the left lateral bladder wall with some adherent debris.   Surgical pathology consistent with inflamed and focally ulcerative urothelium with focal dystrophic calcification negative for malignancy and atypia.  She also had an area c/w squamous metaplasia.  Bilateral retrograde pyelograms were unremarkable.  She was treated for UTI recently on 10/22/2018.   Blood  pressure (!) 148/78, pulse 75, height 5\' 4"  (1.626 m), weight 148 lb (67.1 kg). NED. A&Ox3.   No respiratory distress   Abd soft, NT, ND Normal external genitalia with patent urethral meatus  Cystoscopy Procedure Note  Patient identification was confirmed, informed consent was obtained, and patient was prepped using Betadine solution.  Lidocaine jelly was administered per urethral meatus.    Preoperative abx where received prior to procedure.    Procedure: - Flexible cystoscope introduced, without any difficulty.   - Thorough search of the bladder revealed:    normal urethral meatus    Dystrophic calcifications seen in previous biopsy sites including the posterior and left lateral bladder wall with slight surrounding erythema, stable from last cystoscopy/ biopsy    no stones    no ulcers     no tumors    no urethral polyps    no trabeculation  - Ureteral orifices were normal in position and appearance.  Post-Procedure: - Patient tolerated the procedure well  Assessment/ Plan:  1. Erythematous bladder mucosa Previously biopsied areas consistent with dystrophic calcification We will continue to follow these areas cystoscopically Repeat cystoscopy in 3 months -urine cytology today - Urinalysis, Complete - lidocaine (XYLOCAINE) 2 % jelly 1 application - CULTURE, URINE COMPREHENSIVE  2. History of bladder cancer As above  Return in about 3 months (around 02/06/2019) for cystoscopy.   - Urinalysis, Complete - ciprofloxacin (CIPRO) tablet 500 mg - lidocaine (XYLOCAINE) 2 % jelly 1 application   Kim Espy, MD

## 2018-11-11 ENCOUNTER — Other Ambulatory Visit: Payer: Self-pay | Admitting: Urology

## 2019-02-05 ENCOUNTER — Other Ambulatory Visit: Payer: Self-pay

## 2019-02-05 ENCOUNTER — Ambulatory Visit: Payer: Medicare Other | Admitting: Urology

## 2019-02-05 ENCOUNTER — Encounter: Payer: Self-pay | Admitting: Urology

## 2019-02-05 VITALS — BP 163/78 | HR 78 | Ht 64.0 in | Wt 151.0 lb

## 2019-02-05 DIAGNOSIS — Z8551 Personal history of malignant neoplasm of bladder: Secondary | ICD-10-CM

## 2019-02-05 LAB — MICROSCOPIC EXAMINATION
Bacteria, UA: NONE SEEN
RBC, Urine: NONE SEEN /hpf (ref 0–2)
WBC, UA: NONE SEEN /hpf (ref 0–5)

## 2019-02-05 LAB — URINALYSIS, COMPLETE
Bilirubin, UA: NEGATIVE
Glucose, UA: NEGATIVE
Ketones, UA: NEGATIVE
Leukocytes,UA: NEGATIVE
Nitrite, UA: NEGATIVE
Protein,UA: NEGATIVE
Specific Gravity, UA: <1.005 — ABNORMAL LOW (ref 1.005–1.030)
Urobilinogen, Ur: 0.2 mg/dL (ref 0.2–1.0)
pH, UA: 5.5 (ref 5.0–7.5)

## 2019-02-05 NOTE — Progress Notes (Signed)
02/05/19  CC:  Chief Complaint  Patient presents with  . Cysto    HPI: 78 year old female who presents today for surveillance cystoscopy.  Ms. Devincenzi was found to have 2 small tumors on the right lateral wall just lateral to the ureteral orifice. Each tumor was approximately 1 cm in size, on a narrow stalk, low-grade appearing during a cystoscopy performed on 06/27/2016 with Dr. Louis Meckel.   She underwent TURBT on 07/11/2016 with Dr. Erlene Quan.  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 It was indeterminate if there was muscularispropria in the specimen.  Repeated TURBT was performed on 08/30/2016 with Dr. Pilar Jarvis.  Pathology was negative for residual carcinoma and muscularis propria was present.   She completed a 6 weeks induction course of BCG on 12/07/2016.    She underwent a surveillance cystoscopy with Dr. Pilar Jarvis on 03/01/2017 and a necrotic lesion on the right lateral wallapparent TURBT scar. She also has an erythematous 2-3 cm lesion on the left upper lateral wall concerning for a new neoplasm. There are various erythematous patches at the bladder that arepossibly CIS. TURBT was completed on 03/14/2017 with Dr. Pilar Jarvis.  Pathology was benign.    Surveillance cystoscopy completed on 06/21/2017 with Dr. Pilar Jarvis noted Previous TUR sites noted on left and right lateral wall. Similar in appearance from time of negative TURBT in July 2018.  Cytology remained negative.  She completed maintenance BCG 06/2017 x 3.  Most recently, she returned to the operating room on 03/2018 with recurrent erythema on the left lateral bladder wall with some adherent debris.   Surgical pathology consistent with inflamed and focally ulcerative urothelium with focal dystrophic calcification negative for malignancy and atypia.  She also had an area c/w squamous metaplasia.  Bilateral retrograde pyelograms were unremarkable.  She was treated for UTI recently on 10/22/2018.   Urine  cytology negative 10/2018   Today's Vitals   02/05/19 0952  BP: (!) 163/78  Pulse: 78  Weight: 151 lb (68.5 kg)  Height: 5\' 4"  (1.626 m)   Body mass index is 25.92 kg/m. NED. A&Ox3.   No respiratory distress   Abd soft, NT, ND Normal external genitalia with patent urethral meatus  Cystoscopy Procedure Note  Patient identification was confirmed, informed consent was obtained, and patient was prepped using Betadine solution.  Lidocaine jelly was administered per urethral meatus.    Preoperative abx where received prior to procedure.    Procedure: - Flexible cystoscope introduced, without any difficulty.   - Thorough search of the bladder revealed:    normal urethral meatus    Focal erythema previous biopsy sites including the posterior and left lateral bladder wall with slight surrounding erythema, stable from last cystoscopy/ biopsy (with no calcification today)    no stones    no ulcers     no tumors    no urethral polyps    no trabeculation  - Ureteral orifices were normal in position and appearance.  Post-Procedure: - Patient tolerated the procedure well  Assessment/ Plan:  1. Erythematous bladder mucosa Previously biopsied areas consistent with dystrophic calcification We will continue to follow these areas cystoscopically Repeat cystoscopy in 6  Months Previous urology cytology negative - Urinalysis, Complete - lidocaine (XYLOCAINE) 2 % jelly 1 application - CULTURE, URINE COMPREHENSIVE  2. History of bladder cancer As above  Return in about 6 months (around 08/07/2019) for cysto.   - Urinalysis, Complete - ciprofloxacin (CIPRO) tablet 500 mg - lidocaine (XYLOCAINE) 2 % jelly 1 application  [name], MD

## 2019-02-18 ENCOUNTER — Other Ambulatory Visit: Payer: Self-pay

## 2019-02-18 ENCOUNTER — Encounter: Payer: Self-pay | Admitting: Family Medicine

## 2019-02-18 ENCOUNTER — Ambulatory Visit (INDEPENDENT_AMBULATORY_CARE_PROVIDER_SITE_OTHER): Payer: Medicare Other | Admitting: Family Medicine

## 2019-02-18 VITALS — BP 156/81 | HR 84 | Ht 64.0 in | Wt 151.0 lb

## 2019-02-18 DIAGNOSIS — R300 Dysuria: Secondary | ICD-10-CM | POA: Diagnosis not present

## 2019-02-18 LAB — URINALYSIS, COMPLETE
Bilirubin, UA: NEGATIVE
Glucose, UA: NEGATIVE
Ketones, UA: NEGATIVE
Nitrite, UA: NEGATIVE
Specific Gravity, UA: <1.005 — ABNORMAL LOW (ref 1.005–1.030)
Urobilinogen, Ur: 0.2 mg/dL (ref 0.2–1.0)
pH, UA: 5.5 (ref 5.0–7.5)

## 2019-02-18 LAB — MICROSCOPIC EXAMINATION
Epithelial Cells (non renal): >10 /hpf — ABNORMAL HIGH (ref 0–10)
RBC, Urine: >30 /hpf — ABNORMAL HIGH (ref 0–2)

## 2019-02-18 MED ORDER — SULFAMETHOXAZOLE-TRIMETHOPRIM 800-160 MG PO TABS: 1.0000 | ORAL_TABLET | Freq: Two times a day (BID) | ORAL | 0 refills | Status: DC

## 2019-02-18 NOTE — Progress Notes (Signed)
Patient presents today with complaints of dysuria and lower abdominal pain. Patient states she has not been on any abx in the last 30 days. She did have a Cystoscopy on 02/05/2019. A urine was collected for UA, UCX. Dr. Erlene Quan reviewed the UA in office and Bactrim was sent to pharmacy.

## 2019-02-18 NOTE — Addendum Note (Signed)
Addended by: Kyra Manges on: 02/18/2019 12:10 PM   Modules accepted: Level of Service

## 2019-02-21 LAB — CULTURE, URINE COMPREHENSIVE

## 2019-03-19 IMAGING — CT CT HEAD W/O CM
3 series · 15 of 44 positions shown, 18 images · non-contrast
Comparison: None.

CLINICAL DATA: Acute onset of headache, dizziness and nausea.

EXAM:
CT HEAD WITHOUT CONTRAST
TECHNIQUE: Contiguous axial images were obtained from the base of the skull
through the vertex without intravenous contrast.

[Series 3: head wo · axial · 0.40mm/px · z∈[-133,-23]mm · 9 of 27 slices shown, 12 images]
[im 3/27  brain]
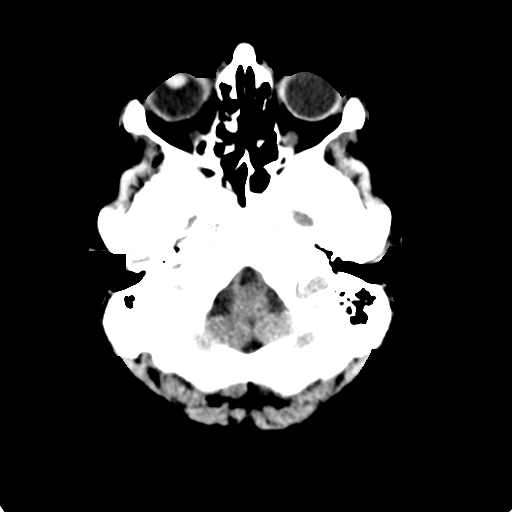
[im 3/27  bone]
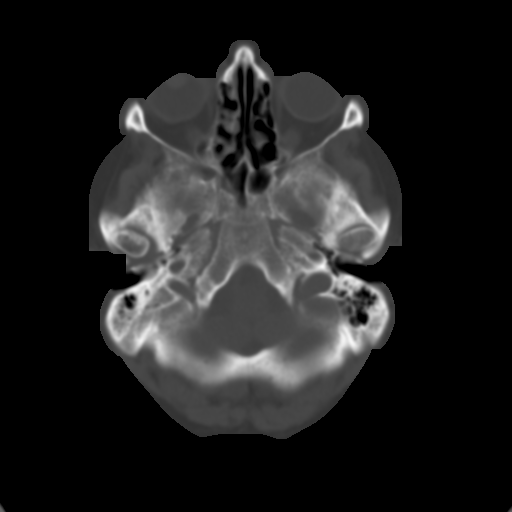
[im 6/27  brain]
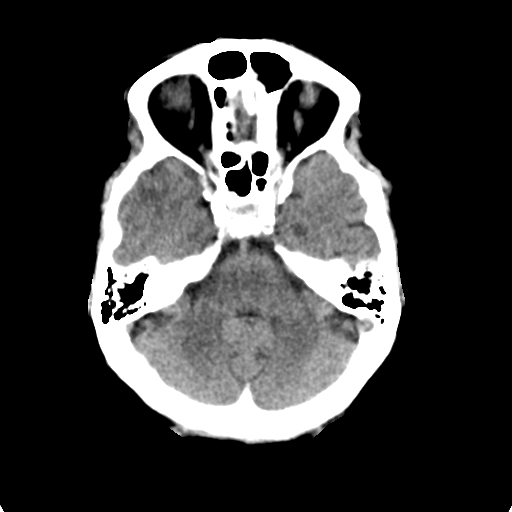
[im 8/27  brain]
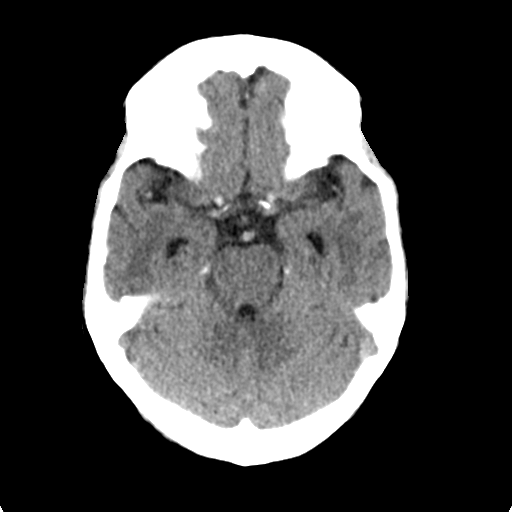
[im 11/27  brain]
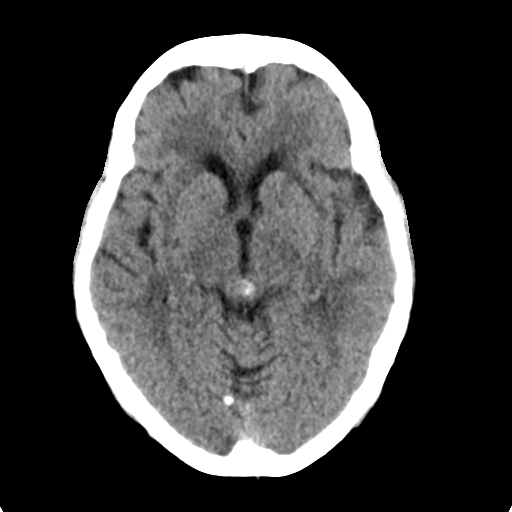
[im 14/27  brain]
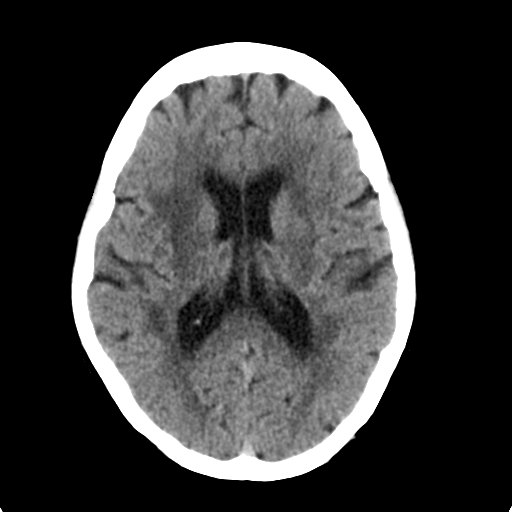
[im 14/27  bone]
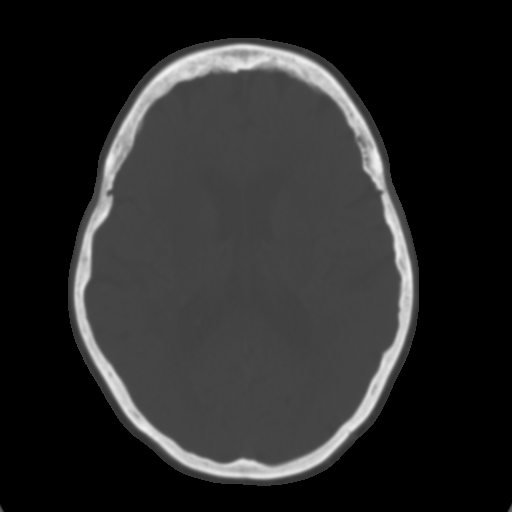
[im 17/27  brain]
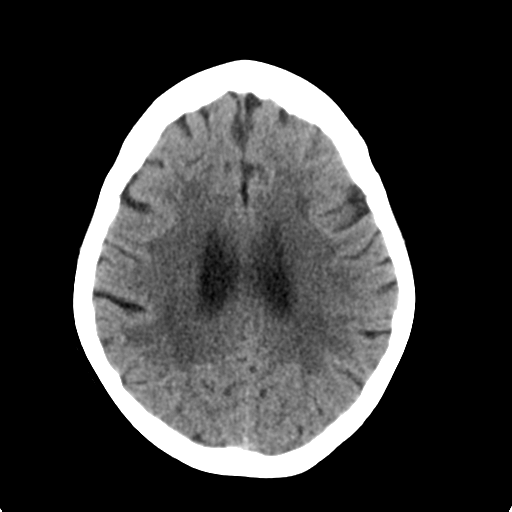
[im 20/27  brain]
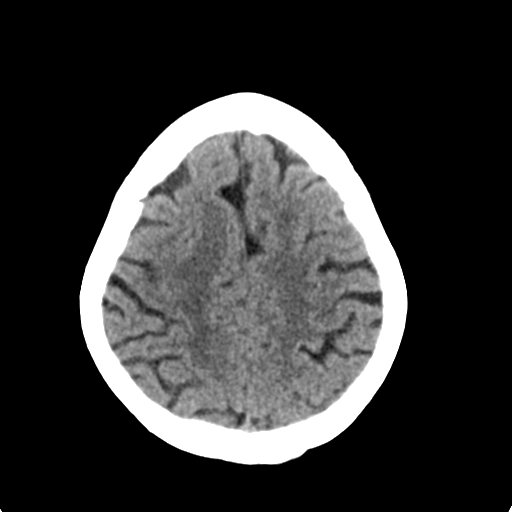
[im 22/27  brain]
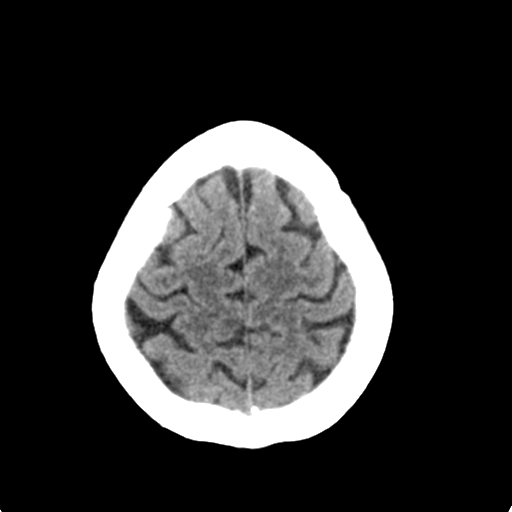
[im 25/27  brain]
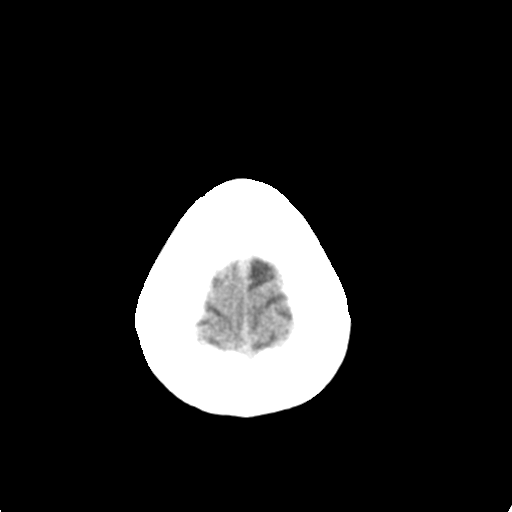
[im 25/27  bone]
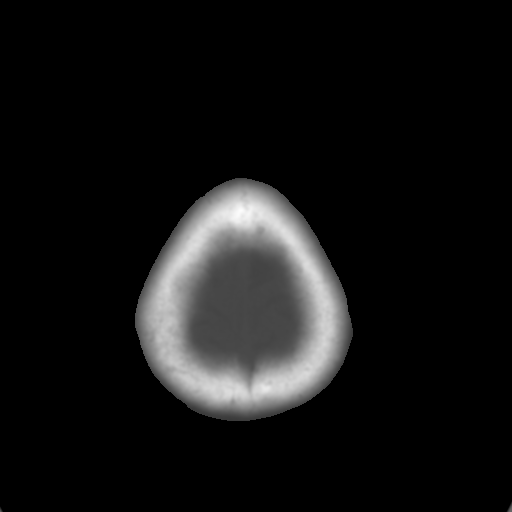

[Series 4: coronal soft tissue · coronal · 0.27mm/px · 3 of 60 slices shown]
[im 20/60  brain]
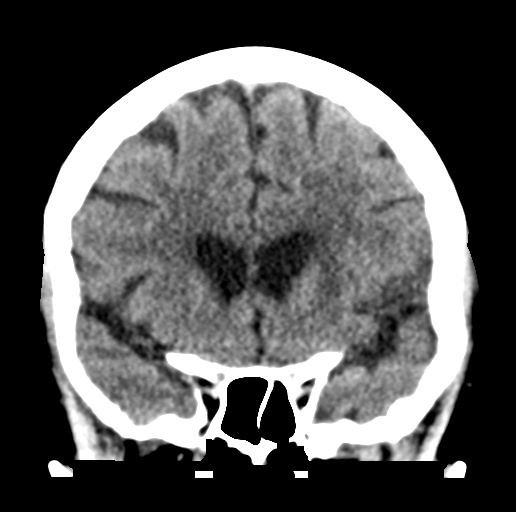
[im 27/60  brain]
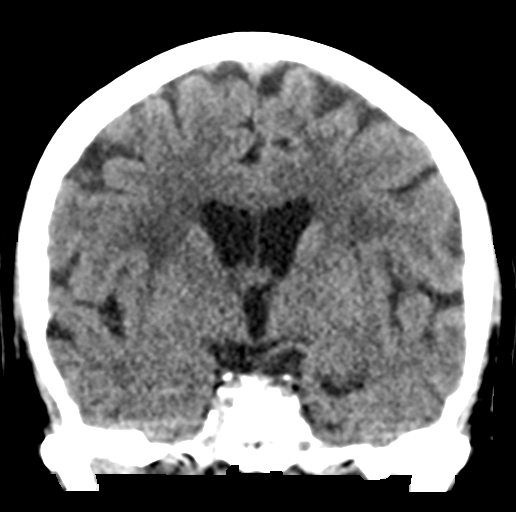
[im 33/60  brain]
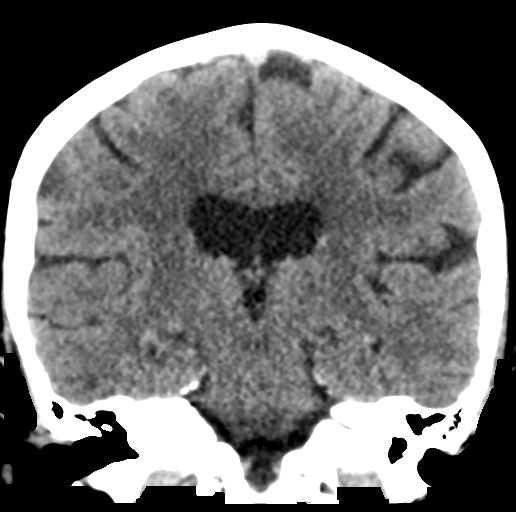

[Series 5: sagittal soft tissue · sagittal · 0.27mm/px · 3 of 47 slices shown]
[im 16/47  brain]
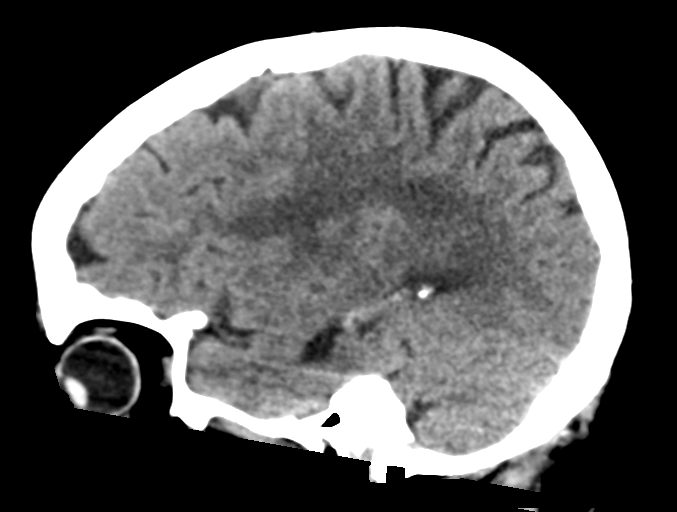
[im 24/47  brain]
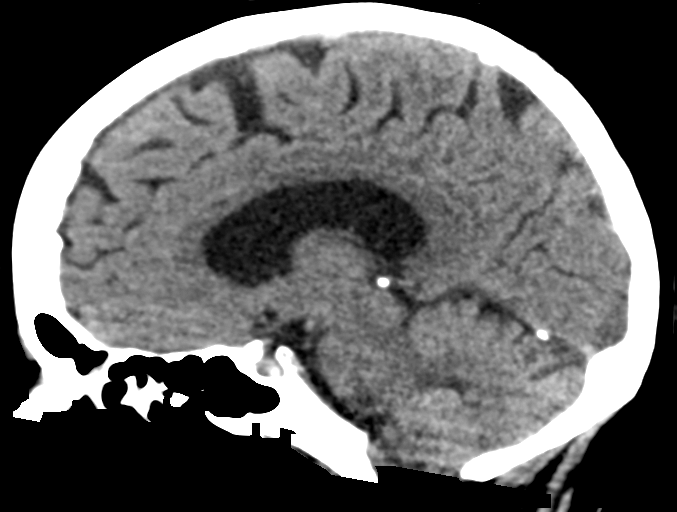
[im 31/47  brain]
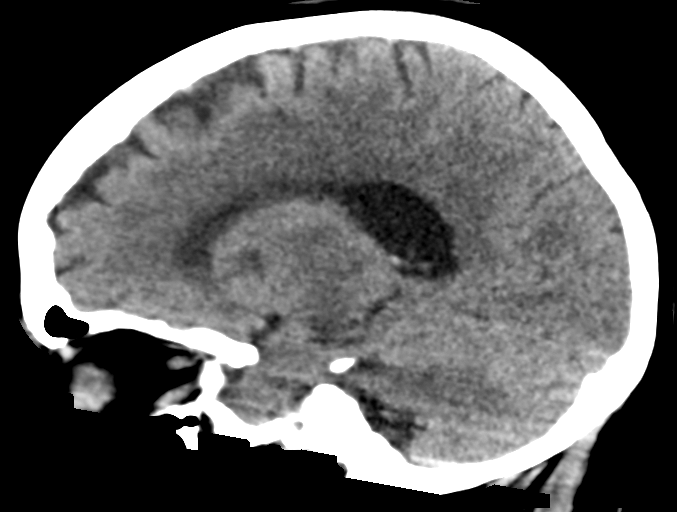

[15 of 44 positions shown; findings below may reference images not displayed]

FINDINGS: Brain: No evidence of acute infarction, hemorrhage, hydrocephalus,
extra-axial collection or mass lesion / mass effect.

Prominence of the ventricles and sulci reflects mild cortical volume
loss. Mild cerebellar atrophy is noted. Scattered periventricular
and subcortical matter change likely reflects small vessel ischemic
microangiopathy.

The brainstem and fourth ventricle are within normal limits. The
basal ganglia are unremarkable in appearance. The cerebral
hemispheres demonstrate grossly normal gray-white differentiation.
No mass effect or midline shift is seen.

Vascular: No hyperdense vessel or unexpected calcification.

Skull: There is no evidence of fracture; visualized osseous
structures are unremarkable in appearance.

Sinuses/Orbits: The visualized portions of the orbits are within
normal limits. The paranasal sinuses and mastoid air cells are
well-aerated.

Other: No significant soft tissue abnormalities are seen.
IMPRESSION: 1. No acute intracranial pathology seen on CT.
2. Mild cortical volume loss and scattered small vessel ischemic
microangiopathy.

## 2019-04-07 ENCOUNTER — Ambulatory Visit: Payer: Medicare Other | Admitting: Physician Assistant

## 2019-04-07 ENCOUNTER — Encounter: Payer: Self-pay | Admitting: Physician Assistant

## 2019-04-07 ENCOUNTER — Other Ambulatory Visit: Payer: Self-pay

## 2019-04-07 VITALS — BP 149/80 | HR 77 | Ht 64.0 in | Wt 149.0 lb

## 2019-04-07 DIAGNOSIS — R3915 Urgency of urination: Secondary | ICD-10-CM

## 2019-04-07 DIAGNOSIS — R300 Dysuria: Secondary | ICD-10-CM | POA: Diagnosis not present

## 2019-04-07 DIAGNOSIS — R350 Frequency of micturition: Secondary | ICD-10-CM

## 2019-04-07 LAB — URINALYSIS, COMPLETE
Bilirubin, UA: NEGATIVE
Glucose, UA: NEGATIVE
Ketones, UA: NEGATIVE
Leukocytes,UA: NEGATIVE
Nitrite, UA: NEGATIVE
Protein,UA: NEGATIVE
Specific Gravity, UA: <1.005 — ABNORMAL LOW (ref 1.005–1.030)
Urobilinogen, Ur: 0.2 mg/dL (ref 0.2–1.0)
pH, UA: 5.5 (ref 5.0–7.5)

## 2019-04-07 LAB — MICROSCOPIC EXAMINATION: RBC: NONE SEEN /hpf (ref 0–2)

## 2019-04-07 NOTE — Progress Notes (Signed)
04/07/2019 4:59 PM   Bonnita Nasuti [name] February 14, 1941 [id number]  CC: Dysuria, urinary urgency and frequency  HPI: Kim Espinoza is a 78 y.o. female who presents today for evaluation of possible UTI. She is an established [facility] patient who last saw Dr. Erlene Quan on 02/05/2019 for cystoscopy with PMH recurrent bladder cancer.  No evidence of disease seen at that time.  She also sought care in our clinic on 02/18/2019 with dysuria and lower abdominal pain. She was treated with Bactrim for grossly positive UA.  She reports 4 days of dysuria urinary urgency and frequency with associated dark-colored urine that occurred last week.  She says her symptoms have largely resolved today.  She denies a history of constipation, however she does state that her hemorrhoids have been bothering her lately.  She does not have a history of recurrent UTI.  In-office UA today positive for trace intact blood; urine microscopy pan negative.  PMH: Past Medical History:  Diagnosis Date  . Abnormal mammogram 2011, 2012   Right side.   . Anxiety   . Arthritis    fingers  . Breast screening, unspecified   . Cancer ([place]) 2018   "bladder stage 1"  . Hypercholesterolemia since 2001  . Hypertension    since approx. 2001  . Mammographic microcalcification   . Personal history of tobacco use, presenting hazards to health   . Special screening for malignant neoplasms, colon   . 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 BREAST BIOPSY Right ??   x2 - core w/clip - neg  . BREAST BIOPSY-right  2011   Stereotactic - Dr. Bary Castilla- Benign breast tissue containing stromal calcifications wiithin.  Periductal Fibrosis.  Marland Kitchen BREAST SURGERY Right 2011, 2013   stero biopsy  . CAROTID ENDARTERECTOMY  2004  . CATARACT EXTRACTION EXTRACAPSULAR  2013   Left eye  . CATARACT EXTRACTION W/PHACO Right 10/28/2018   Procedure: CATARACT EXTRACTION PHACO AND INTRAOCULAR LENS  PLACEMENT ([place]) RIGHT;  Surgeon: Eulogio Bear, MD;  Location: [place] [facility];  Service: Ophthalmology;  Laterality: Right;  . COLONOSCOPY  2012   Dr. Bary Castilla  . CYSTOSCOPY W/ RETROGRADES Bilateral 04/17/2018   Procedure: CYSTOSCOPY WITH RETROGRADE PYELOGRAM;  Surgeon: [name], MD;  Location: [facility];  Service: Urology;  Laterality: Bilateral;  . CYSTOSCOPY WITH BIOPSY N/A 04/17/2018   Procedure: CYSTOSCOPY WITH Bladder BIOPSY;  Surgeon: [name], MD;  Location: [facility];  Service: Urology;  Laterality: N/A;  . EYE SURGERY Left    cataract   . TONSILLECTOMY    . TRANSURETHRAL RESECTION OF BLADDER TUMOR N/A 08/30/2016   Procedure: TRANSURETHRAL RESECTION OF BLADDER TUMOR (TURBT) SMALL;  Surgeon: [name], MD;  Location: [facility];  Service: Urology;  Laterality: N/A;  . TRANSURETHRAL RESECTION OF BLADDER TUMOR N/A 03/14/2017   Procedure: TRANSURETHRAL RESECTION OF BLADDER TUMOR (TURBT)-MEDIUM;  Surgeon: [name], MD;  Location: [facility];  Service: Urology;  Laterality: N/A;  . TRANSURETHRAL RESECTION OF BLADDER TUMOR WITH MITOMYCIN-C N/A 07/11/2016   Procedure: TRANSURETHRAL RESECTION OF BLADDER TUMOR WITH MITOMYCIN-C;  Surgeon: [name], MD;  Location: [facility];  Service: Urology;  Laterality: N/A;  . TUBAL LIGATION    . VASCULAR SURGERY Left 2004   carotid endarderectomy    Home Medications:  Allergies as of 04/07/2019   No Known Allergies     Medication List       Accurate as of April 07, 2019  4:59 PM. If you have any questions, ask your nurse or doctor.        ALPRAZolam 0.25 MG tablet Commonly known as: XANAX Take 0.25 mg by mouth daily as needed for anxiety.   amLODipine 5 MG tablet Commonly known as: NORVASC Take 5 mg by mouth daily. In the morning   aspirin EC 81 MG tablet Take 81 mg by mouth every evening.   atenolol 50 MG tablet Commonly known as: TENORMIN Take 50 mg by mouth 2 (two) times daily.   B-complex  with vitamin C tablet Take 1 tablet by mouth once a week.   Flaxseed Oil 1000 MG Caps Take 1,000 mg by mouth once a week.   FOLIC ACID PO Take 1 tablet by mouth once a week.   nortriptyline 25 MG capsule Commonly known as: PAMELOR Take 25 mg by mouth at bedtime.   simvastatin 10 MG tablet Commonly known as: ZOCOR Take 10 mg by mouth at bedtime.   sulfamethoxazole-trimethoprim 800-160 MG tablet Commonly known as: BACTRIM DS Take 1 tablet by mouth every 12 (twelve) hours.   vitamin B-12 1000 MCG tablet Commonly known as: CYANOCOBALAMIN Take 1,000 mcg by mouth once a week.   vitamin B-6 250 MG tablet Take 250 mg by mouth once a week.       Allergies:  No Known Allergies  Family History: Family History  Problem Relation Age of Onset  . Cancer Father        lung cancer - deceased  . Cancer Sister 33       breast cancer - alive and well  . Breast cancer Sister 53  . Breast cancer Paternal Aunt   . Colon cancer Neg Hx   . Ovarian cancer Neg Hx   . Hematuria Neg Hx   . Prostate cancer Neg Hx   . Bladder Cancer Neg Hx     Social History:   reports that she quit smoking about 12 years ago. Her smoking use included cigarettes. She smoked 0.50 packs per day. She has never used smokeless tobacco. She reports that she does not drink alcohol or use drugs.  ROS: UROLOGY Frequent Urination?: Yes Hard to postpone urination?: No Burning/pain with urination?: Yes Get up at night to urinate?: Yes Leakage of urine?: No Urine stream starts and stops?: No Trouble starting stream?: No Do you have to strain to urinate?: No Blood in urine?: No Urinary tract infection?: Yes Sexually transmitted disease?: No Injury to kidneys or bladder?: No Painful intercourse?: No Weak stream?: No Currently pregnant?: No Vaginal bleeding?: No Last menstrual period?: n  Gastrointestinal Nausea?: No Vomiting?: No Indigestion/heartburn?: No Diarrhea?: No Constipation?: No   Constitutional Fever: No Night sweats?: No Weight loss?: No Fatigue?: No  Skin Skin rash/lesions?: No Itching?: No  Eyes Blurred vision?: No Double vision?: No  Ears/Nose/Throat Sore throat?: No Sinus problems?: No  Hematologic/Lymphatic Swollen glands?: No Easy bruising?: No  Cardiovascular Leg swelling?: No Chest pain?: No  Respiratory Cough?: No Shortness of breath?: No  Endocrine Excessive thirst?: No  Musculoskeletal Back pain?: No Joint pain?: No  Neurological Headaches?: No Dizziness?: No  Psychologic Depression?: No Anxiety?: No  Physical Exam: BP (!) 149/80 (BP Location: Left Arm, Patient Position: Sitting, Cuff Size: Normal)   Pulse 77   Ht 5\' 4"  (1.626 m)   Wt 149 lb (67.6 kg)   BMI 25.58 kg/m   Constitutional:  Alert and oriented, no acute distress, nontoxic appearing HEENT: [place], AT Cardiovascular: No clubbing,  cyanosis, or edema Respiratory: Normal respiratory effort, no increased work of breathing Skin: No rashes, bruises or suspicious lesions Neurologic: Grossly intact, no focal deficits, moving all 4 extremities Psychiatric: Normal mood and affect  Laboratory Data: Results for orders placed or performed in visit on 04/07/19  Microscopic Examination   URINE  Result Value Ref Range   WBC, UA 0-5 0 - 5 /hpf   RBC None seen 0 - 2 /hpf   Epithelial Cells (non renal) 0-10 0 - 10 /hpf   Bacteria, UA Few None seen/Few  Urinalysis, Complete  Result Value Ref Range   Specific Gravity, UA <1.005 (L) 1.005 - 1.030   pH, UA 5.5 5.0 - 7.5   Color, UA Yellow Yellow   Appearance Ur Clear Clear   Leukocytes,UA Negative Negative   Protein,UA Negative Negative/Trace   Glucose, UA Negative Negative   Ketones, UA Negative Negative   RBC, UA Trace (A) Negative   Bilirubin, UA Negative Negative   Urobilinogen, Ur 0.2 0.2 - 1.0 mg/dL   Nitrite, UA Negative Negative   Microscopic Examination See below:    Assessment & Plan:   1. Dysuria,  urinary urgency, urinary frequency Patient with irritative voiding symptoms but a negative UA in office today.  She states her symptoms have largely resolved at this point.  I suspect she may have either had a self-limited urinary infection that has since resolved or that her irritative symptoms may have been secondary to constipation and/or dehydration.  We will send urine for culture today.  I counseled the patient to stay well-hydrated in the coming days and consider an over-the-counter bowel regimen featuring fiber supplementation as needed.  She expressed an understanding of this plan. -Urinalysis, Complete -Urine culture  Debroah Loop, [facility]  [place] [address], [place], [place] [address] [phone]

## 2019-04-09 LAB — CULTURE, URINE COMPREHENSIVE

## 2019-07-21 ENCOUNTER — Other Ambulatory Visit: Payer: Self-pay | Admitting: Family Medicine

## 2019-07-21 DIAGNOSIS — Z1231 Encounter for screening mammogram for malignant neoplasm of breast: Secondary | ICD-10-CM

## 2019-08-12 ENCOUNTER — Other Ambulatory Visit: Payer: Medicare Other | Admitting: Urology

## 2019-08-26 ENCOUNTER — Other Ambulatory Visit: Payer: Self-pay

## 2019-08-26 ENCOUNTER — Ambulatory Visit: Payer: Medicare Other | Admitting: Urology

## 2019-08-26 ENCOUNTER — Encounter: Payer: Self-pay | Admitting: Urology

## 2019-08-26 VITALS — BP 155/80 | HR 80 | Ht 64.0 in | Wt 136.0 lb

## 2019-08-26 DIAGNOSIS — Z8551 Personal history of malignant neoplasm of bladder: Secondary | ICD-10-CM

## 2019-08-26 LAB — URINALYSIS, COMPLETE
Bilirubin, UA: NEGATIVE
Glucose, UA: NEGATIVE
Ketones, UA: NEGATIVE
Leukocytes,UA: NEGATIVE
Nitrite, UA: NEGATIVE
Protein,UA: NEGATIVE
Specific Gravity, UA: 1.015 (ref 1.005–1.030)
Urobilinogen, Ur: 1 mg/dL (ref 0.2–1.0)
pH, UA: 5 (ref 5.0–7.5)

## 2019-08-26 LAB — MICROSCOPIC EXAMINATION

## 2019-08-26 NOTE — Progress Notes (Signed)
08/26/19  CC:  Chief Complaint  Patient presents with  . Cysto    HPI: 79 year old female who presents today for surveillance cystoscopy.  Recently dx with rheumatoid arthritis.    Ms. Willow was found to have 2 small tumors on the right lateral wall just lateral to the ureteral orifice. Each tumor was approximately 1 cm in size, on a narrow stalk, low-grade appearing during a cystoscopy performed on 06/27/2016 with Dr. Louis Meckel.   She underwent TURBT on 07/11/2016 with Dr. Erlene Quan.  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 It was indeterminate if there was muscularispropria in the specimen.  Repeated TURBT was performed on 08/30/2016 with Dr. Pilar Jarvis.  Pathology was negative for residual carcinoma and muscularis propria was present.   She completed a 6 weeks induction course of BCG on 12/07/2016.    She underwent a surveillance cystoscopy with Dr. Pilar Jarvis on 03/01/2017 and a necrotic lesion on the right lateral wallapparent TURBT scar. She also has an erythematous 2-3 cm lesion on the left upper lateral wall concerning for a new neoplasm. There are various erythematous patches at the bladder that arepossibly CIS. TURBT was completed on 03/14/2017 with Dr. Pilar Jarvis.  Pathology was benign.    Surveillance cystoscopy completed on 06/21/2017 with Dr. Pilar Jarvis noted Previous TUR sites noted on left and right lateral wall. Similar in appearance from time of negative TURBT in July 2018.  Cytology remained negative.  She completed maintenance BCG 06/2017 x 3.  Most recently, she returned to the operating room on 03/2018 with recurrent erythema on the left lateral bladder wall with some adherent debris.   Surgical pathology consistent with inflamed and focally ulcerative urothelium with focal dystrophic calcification negative for malignancy and atypia.  She also had an area c/w squamous metaplasia.  Bilateral retrograde pyelograms were unremarkable.  Urine cytology  negative 10/2018  No urinary symptoms today   Today's Vitals   08/26/19 1410  BP: (!) 155/80  Pulse: 80  Weight: 136 lb (61.7 kg)  Height: 5\' 4"  (1.626 m)   Body mass index is 23.34 kg/m. NED. A&Ox3.   No respiratory distress   Abd soft, NT, ND Normal external genitalia with patent urethral meatus  Cystoscopy Procedure Note  Patient identification was confirmed, informed consent was obtained, and patient was prepped using Betadine solution.  Lidocaine jelly was administered per urethral meatus.    Preoperative abx where received prior to procedure.    Procedure: - Flexible cystoscope introduced, without any difficulty.   - Thorough search of the bladder revealed:    normal urethral meatus    Nonspecific improved erythema primarily on right lateral bladder wall, more subtile that previous.  Multiple stellate scars appreciated.    no stones    no ulcers     no tumors    no urethral polyps    no trabeculation  - Ureteral orifices were normal in position and appearance.  Post-Procedure: - Patient tolerated the procedure well  Assessment/ Plan:  1. Erythematous bladder mucosa Previously biopsied areas consistent with dystrophic calcification, improved from previous Will continue to follow Previous bx/ cytology negative - Urinalysis, Complete - lidocaine (XYLOCAINE) 2 % jelly 1 application - CULTURE, URINE COMPREHENSIVE  2. History of bladder cancer As above Continue q6 month until 5 year mark (2022) NED  Return in about 6 months (around 02/23/2020) for cysto.   - Urinalysis, Complete - ciprofloxacin (CIPRO) tablet 500 mg - lidocaine (XYLOCAINE) 2 % jelly 1 application   [name], MD

## 2019-11-05 ENCOUNTER — Ambulatory Visit
Admission: RE | Admit: 2019-11-05 | Discharge: 2019-11-05 | Disposition: A | Discharge status: Home / Self Care | Payer: Medicare Other | Source: Ambulatory Visit | Attending: Family Medicine | Admitting: Family Medicine

## 2019-11-05 DIAGNOSIS — Z1231 Encounter for screening mammogram for malignant neoplasm of breast: Secondary | ICD-10-CM | POA: Insufficient documentation

## 2019-11-29 ENCOUNTER — Ambulatory Visit: Payer: Medicare Other | Attending: Oncology

## 2019-11-29 DIAGNOSIS — Z23 Encounter for immunization: Secondary | ICD-10-CM

## 2019-11-29 NOTE — Progress Notes (Signed)
   Covid-19 Vaccination Clinic  Name:  Kim Espinoza    MRN: [id number] DOB: 31-May-1941  11/29/2019  Kim Espinoza was observed post Covid-19 immunization for 15 minutes without incident. She was provided with Vaccine Information Sheet and instruction to access the V-Safe system.   Kim Espinoza was instructed to call 911 with any severe reactions post vaccine: Marland Kitchen Difficulty breathing  . Swelling of face and throat  . A fast heartbeat  . A bad rash all over body  . Dizziness and weakness   Immunizations Administered    Name Date Dose VIS Date Route   Pfizer COVID-19 Vaccine 11/29/2019  8:18 AM 0.3 mL 08/01/2019 Intramuscular   Manufacturer: [place]   Lot: [id number]   [place]: [id number]

## 2019-12-24 ENCOUNTER — Ambulatory Visit: Payer: Medicare Other | Attending: Internal Medicine

## 2019-12-24 DIAGNOSIS — Z23 Encounter for immunization: Secondary | ICD-10-CM

## 2019-12-24 NOTE — Progress Notes (Signed)
   Covid-19 Vaccination Clinic  Name:  Kim Espinoza    MRN: [id number] DOB: 1940/09/27  12/24/2019  Kim Espinoza was observed post Covid-19 immunization for 15 minutes without incident. She was provided with Vaccine Information Sheet and instruction to access the V-Safe system.   Kim Espinoza was instructed to call 911 with any severe reactions post vaccine: Marland Kitchen Difficulty breathing  . Swelling of face and throat  . A fast heartbeat  . A bad rash all over body  . Dizziness and weakness   Immunizations Administered    Name Date Dose VIS Date Route   Pfizer COVID-19 Vaccine 12/24/2019  8:54 AM 0.3 mL 10/15/2018 Intramuscular   Manufacturer: [place]   Lot: [id number]   [place]: [id number]

## 2020-01-05 ENCOUNTER — Other Ambulatory Visit: Payer: Self-pay

## 2020-01-05 ENCOUNTER — Ambulatory Visit: Payer: Medicare Other | Admitting: Urology

## 2020-01-05 ENCOUNTER — Other Ambulatory Visit
Admission: RE | Admit: 2020-01-05 | Discharge: 2020-01-05 | Disposition: A | Discharge status: Home / Self Care | Payer: Medicare Other | Source: Ambulatory Visit | Attending: Urology | Admitting: Urology

## 2020-01-05 ENCOUNTER — Encounter: Payer: Self-pay | Admitting: Urology

## 2020-01-05 ENCOUNTER — Telehealth: Payer: Self-pay | Admitting: Urology

## 2020-01-05 VITALS — BP 171/82 | HR 78 | Ht 64.0 in | Wt 143.0 lb

## 2020-01-05 DIAGNOSIS — R300 Dysuria: Secondary | ICD-10-CM

## 2020-01-05 DIAGNOSIS — Z8551 Personal history of malignant neoplasm of bladder: Secondary | ICD-10-CM | POA: Diagnosis not present

## 2020-01-05 LAB — URINALYSIS, COMPLETE (UACMP) WITH MICROSCOPIC
Bilirubin Urine: NEGATIVE
Glucose, UA: NEGATIVE mg/dL
Ketones, ur: NEGATIVE mg/dL
Nitrite: NEGATIVE
Protein, ur: NEGATIVE mg/dL
Specific Gravity, Urine: 1.025 (ref 1.005–1.030)
pH: 5.5 (ref 5.0–8.0)

## 2020-01-05 LAB — BLADDER SCAN AMB NON-IMAGING: Scan Result: 14

## 2020-01-05 MED ORDER — NITROFURANTOIN MONOHYD MACRO 100 MG PO CAPS
100.0000 mg | ORAL_CAPSULE | Freq: Two times a day (BID) | ORAL | 0 refills | Status: DC
Start: 2020-01-05 — End: 2020-02-05

## 2020-01-05 NOTE — Telephone Encounter (Signed)
painful urination with burning x 1.5 weeks denies fever/chills/nausea/vomiting/flank or low back pain appt made in [place] office

## 2020-01-05 NOTE — Progress Notes (Signed)
01/05/2020 1:46 PM   Bonnita Nasuti [name] Apr 20, 1941 [id number]  Referring provider: [name], MD [address] [facility] [place],  [place] [address]  Chief Complaint  Patient presents with  . Dysuria    painful urination with burning x 1 week    HPI: Mrs. Kim Espinoza is a 79 year old female with history of bladder cancer who presents today for an appointment due to 1 1/2 weeks of painful urination.    She has a history of bladder cancer.- see cystoscopy note dated 08/26/2019.  She is scheduled for a surveillance cystoscopy 02/24/2020 with Dr. Erlene Quan.  Today, she is experiencing frequency, urgency and dysuria that has been present for 1 1/2 weeks.   She was hoping her symptoms would be self-limiting, but they have persisted.  Patient denies any modifying or aggravating factors.  Patient denies any gross hematuria, dysuria or suprapubic/flank pain.  Patient denies any fevers, chills, nausea or vomiting.   UA yellow clear, trace leukocytes, 0-5 squamous epithelial cells, 6-10 WBCs, 6-10 RBCs and rare bacteria.  Her PVR is 14 mL.    PMH: Past Medical History:  Diagnosis Date  . Abnormal mammogram 2011, 2012   Right side.   . Anxiety   . Arthritis    fingers  . Breast screening, unspecified   . Cancer ([place]) 2018   "bladder stage 1"  . Hypercholesterolemia since 2001  . Hypertension    since approx. 2001  . Mammographic microcalcification   . Personal history of tobacco use, presenting hazards to health   . Special screening for malignant neoplasms, colon   . 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 BREAST BIOPSY Right ??   x2 - core w/clip - neg  . BREAST BIOPSY-right  2011   Stereotactic - Dr. Bary Castilla- Benign breast tissue containing stromal calcifications wiithin.  Periductal Fibrosis.  Marland Kitchen BREAST SURGERY Right 2011, 2013   stero biopsy  . CAROTID ENDARTERECTOMY  2004  . CATARACT EXTRACTION EXTRACAPSULAR   2013   Left eye  . CATARACT EXTRACTION W/PHACO Right 10/28/2018   Procedure: CATARACT EXTRACTION PHACO AND INTRAOCULAR LENS PLACEMENT ([place]) RIGHT;  Surgeon: Eulogio Bear, MD;  Location: [place];  Service: Ophthalmology;  Laterality: Right;  . COLONOSCOPY  2012   Dr. Bary Castilla  . CYSTOSCOPY W/ RETROGRADES Bilateral 04/17/2018   Procedure: CYSTOSCOPY WITH RETROGRADE PYELOGRAM;  Surgeon: [name], MD;  Location: [facility];  Service: Urology;  Laterality: Bilateral;  . CYSTOSCOPY WITH BIOPSY N/A 04/17/2018   Procedure: CYSTOSCOPY WITH Bladder BIOPSY;  Surgeon: [name], MD;  Location: [facility];  Service: Urology;  Laterality: N/A;  . EYE SURGERY Left    cataract   . TONSILLECTOMY    . TRANSURETHRAL RESECTION OF BLADDER TUMOR N/A 08/30/2016   Procedure: TRANSURETHRAL RESECTION OF BLADDER TUMOR (TURBT) SMALL;  Surgeon: [name], MD;  Location: [facility];  Service: Urology;  Laterality: N/A;  . TRANSURETHRAL RESECTION OF BLADDER TUMOR N/A 03/14/2017   Procedure: TRANSURETHRAL RESECTION OF BLADDER TUMOR (TURBT)-MEDIUM;  Surgeon: [name], MD;  Location: [facility];  Service: Urology;  Laterality: N/A;  . TRANSURETHRAL RESECTION OF BLADDER TUMOR WITH MITOMYCIN-C N/A 07/11/2016   Procedure: TRANSURETHRAL RESECTION OF BLADDER TUMOR WITH MITOMYCIN-C;  Surgeon: [name], MD;  Location: [facility];  Service: Urology;  Laterality: N/A;  . TUBAL LIGATION    . VASCULAR SURGERY Left 2004   carotid endarderectomy  Home Medications:  Allergies as of 01/05/2020   No Known Allergies     Medication List       Accurate as of Jan 05, 2020  1:46 PM. If you have any questions, ask your nurse or doctor.        ALPRAZolam 0.25 MG tablet Commonly known as: XANAX Take 0.25 mg by mouth daily as needed for anxiety.   amLODipine 5 MG tablet Commonly known as: NORVASC Take 5 mg by mouth daily. In the morning   aspirin EC 81 MG tablet Take 81 mg by mouth every  evening.   atenolol 50 MG tablet Commonly known as: TENORMIN Take 50 mg by mouth 2 (two) times daily.   B-complex with vitamin C tablet Take 1 tablet by mouth once a week.   Flaxseed Oil 1000 MG Caps Take 1,000 mg by mouth once a week.   folic acid 1 MG tablet Commonly known as: FOLVITE Take 1 tablet by mouth daily. What changed: Another medication with the same name was removed. Continue taking this medication, and follow the directions you see here. Changed by: Zara Council, PA-C   methotrexate 2.5 MG tablet Commonly known as: RHEUMATREX Take 8 tablets by mouth once a week.   nitrofurantoin (macrocrystal-monohydrate) 100 MG capsule Commonly known as: MACROBID Take 1 capsule (100 mg total) by mouth every 12 (twelve) hours. Started by: Zara Council, PA-C   nortriptyline 25 MG capsule Commonly known as: PAMELOR Take 25 mg by mouth at bedtime.   predniSONE 5 MG tablet Commonly known as: DELTASONE Take 1 tablet by mouth daily.   simvastatin 10 MG tablet Commonly known as: ZOCOR Take 10 mg by mouth at bedtime.   vitamin B-12 1000 MCG tablet Commonly known as: CYANOCOBALAMIN Take 1,000 mcg by mouth once a week.   vitamin B-6 250 MG tablet Take 250 mg by mouth once a week.       Allergies: No Known Allergies  Family History: Family History  Problem Relation Age of Onset  . Cancer Father        lung cancer - deceased  . Cancer Sister 67       breast cancer - alive and well  . Breast cancer Sister 30  . Breast cancer Paternal Aunt   . Colon cancer Neg Hx   . Ovarian cancer Neg Hx   . Hematuria Neg Hx   . Prostate cancer Neg Hx   . Bladder Cancer Neg Hx     Social History:  reports that she quit smoking about 13 years ago. Her smoking use included cigarettes. She smoked 0.50 packs per day. She has never used smokeless tobacco. She reports that she does not drink alcohol or use drugs.  ROS: Pertinent ROS in HPI  Physical Exam: BP (!) 171/82    Pulse 78   Ht 5\' 4"  (1.626 m)   Wt 143 lb (64.9 kg)   BMI 24.55 kg/m   Constitutional:  Well nourished. Alert and oriented, No acute distress. HEENT: [place] AT, mask in place.  Trachea midline Cardiovascular: No clubbing, cyanosis, or edema. Respiratory: Normal respiratory effort, no increased work of breathing. Neurologic: Grossly intact, no focal deficits, moving all 4 extremities. Psychiatric: Normal mood and affect.  Laboratory Data: Lab Results  Component Value Date   WBC 8.0 04/09/2018   HGB 14.6 04/09/2018   HCT 42.6 04/09/2018   MCV 93.1 04/09/2018   PLT 219 04/09/2018    Lab Results  Component Value Date   CREATININE  0.64 04/09/2018    Lab Results  Component Value Date   AST 35 11/01/2017   Lab Results  Component Value Date   ALT 30 11/01/2017    Urinalysis Component     Latest Ref Rng & Units 01/05/2020          Color, Urine     YELLOW YELLOW  Appearance     CLEAR CLEAR  Specific Gravity, Urine     1.005 - 1.030 1.025  pH     5.0 - 8.0 5.5  Glucose, UA     NEGATIVE mg/dL NEGATIVE  Hgb urine dipstick     NEGATIVE MODERATE (A)  Bilirubin Urine     NEGATIVE NEGATIVE  Ketones, ur     NEGATIVE mg/dL NEGATIVE  Protein     NEGATIVE mg/dL NEGATIVE  Nitrite     NEGATIVE NEGATIVE  Leukocytes,Ua     NEGATIVE TRACE (A)  Squamous Epithelial / LPF     0 - 5 0-5  WBC, UA     0 - 5 WBC/hpf 6-10  RBC / HPF     0 - 5 RBC/hpf 6-10  Bacteria, UA     NONE SEEN RARE (A)    I have reviewed the labs.   Pertinent Imaging: Results for ANGIE, SHOAF (MRN [id number]) as of 01/05/2020 13:52  Ref. Range 01/05/2020 13:08  Scan Result Unknown 14 ml   Assessment & Plan:    1. Dysuria -Likely secondary to a urinary tract infection - BLADDER SCAN AMB NON-IMAGING - Urine Culture; Future - Urinalysis, Complete w Microscopic; Future -Prescription given for Macrobid 100 mg, twice daily x7 days -We will adjust if necessary once culture results are  available   2. History of bladder cancer -Has surveillance cystoscopy scheduled for July with Dr. Erlene Quan    Return for Pending urine culture results .  These notes generated with voice recognition software. I apologize for typographical errors.  Zara Council, PA-C  [facility] [address]  [place], [place] [address] [phone]

## 2020-01-07 ENCOUNTER — Telehealth: Payer: Self-pay | Admitting: Family Medicine

## 2020-01-07 LAB — URINE CULTURE

## 2020-01-07 NOTE — Telephone Encounter (Signed)
Patient notified and voiced understanding. Appointment made. 

## 2020-01-07 NOTE — Telephone Encounter (Signed)
LMOM for patient to return call.

## 2020-01-07 NOTE — Telephone Encounter (Signed)
-----   Message from Nori Riis, PA-C sent at 01/07/2020  8:03 AM EDT ----- Please let Mrs. Cherian know that we need to have her come in for a CATH UA for urinalysis and culture as her urine culture has returned showing multiple bacteria.

## 2020-01-08 ENCOUNTER — Encounter: Payer: Self-pay | Admitting: Urology

## 2020-01-08 ENCOUNTER — Ambulatory Visit: Payer: Medicare Other | Admitting: Urology

## 2020-01-08 ENCOUNTER — Other Ambulatory Visit: Payer: Self-pay

## 2020-01-08 VITALS — BP 151/72 | HR 81 | Ht 64.0 in | Wt 142.0 lb

## 2020-01-08 DIAGNOSIS — R300 Dysuria: Secondary | ICD-10-CM | POA: Diagnosis not present

## 2020-01-08 NOTE — Progress Notes (Signed)
01/08/2020 6:26 PM   Bonnita Nasuti [name] 1941/07/03 [id number]  Referring provider: [name], MD [address] [facility] [place],   [id number]  Chief Complaint  Patient presents with  . Dysuria    HPI: Mrs. Kim Espinoza is a 79 year old female with history of bladder cancer who presents today for a CATH UA.    She has a history of bladder cancer.- see cystoscopy note dated 08/26/2019.  She is scheduled for a surveillance cystoscopy 02/24/2020 with Dr. Erlene Quan.  She presented on 01/05/2020, she was experiencing frequency, urgency and dysuria that has been present for 1 1/2 weeks.   She was hoping her symptoms would be self-limiting, but they have persisted.  Patient denies any modifying or aggravating factors.  Patient denies any gross hematuria, dysuria or suprapubic/flank pain.  Patient denies any fevers, chills, nausea or vomiting.   UA yellow clear, trace leukocytes, 0-5 squamous epithelial cells, 6-10 WBCs, 6-10 RBCs and rare bacteria.  Her PVR is 14 mL.  Her urine culture grew out multiple species.  Today, she is still experiencing frequency and dysuria.  She continues her nitrofurantoin 100 mg twice daily.  Patient denies any modifying or aggravating factors.  Patient denies any gross hematuria or suprapubic/flank pain.  Patient denies any fevers, chills, nausea or vomiting.   Her CATH UA with 6-10 WBCs and 3-10 RBCs.  PMH: Past Medical History:  Diagnosis Date  . Abnormal mammogram 2011, 2012   Right side.   . Anxiety   . Arthritis    fingers  . Breast screening, unspecified   . Cancer ([place]) 2018   "bladder stage 1"  . Hypercholesterolemia since 2001  . Hypertension    since approx. 2001  . Mammographic microcalcification   . Personal history of tobacco use, presenting hazards to health   . Special screening for malignant neoplasms, colon   . 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 BREAST  BIOPSY Right ??   x2 - core w/clip - neg  . BREAST BIOPSY-right  2011   Stereotactic - Dr. Bary Castilla- Benign breast tissue containing stromal calcifications wiithin.  Periductal Fibrosis.  Marland Kitchen BREAST SURGERY Right 2011, 2013   stero biopsy  . CAROTID ENDARTERECTOMY  2004  . CATARACT EXTRACTION EXTRACAPSULAR  2013   Left eye  . CATARACT EXTRACTION W/PHACO Right 10/28/2018   Procedure: CATARACT EXTRACTION PHACO AND INTRAOCULAR LENS PLACEMENT ([place]) RIGHT;  Surgeon: Eulogio Bear, MD;  Location: [facility];  Service: Ophthalmology;  Laterality: Right;  . COLONOSCOPY  2012   Dr. Bary Castilla  . CYSTOSCOPY W/ RETROGRADES Bilateral 04/17/2018   Procedure: CYSTOSCOPY WITH RETROGRADE PYELOGRAM;  Surgeon: [name], MD;  Location: [facility];  Service: Urology;  Laterality: Bilateral;  . CYSTOSCOPY WITH BIOPSY N/A 04/17/2018   Procedure: CYSTOSCOPY WITH Bladder BIOPSY;  Surgeon: [name], MD;  Location: [facility];  Service: Urology;  Laterality: N/A;  . EYE SURGERY Left    cataract   . TONSILLECTOMY    . TRANSURETHRAL RESECTION OF BLADDER TUMOR N/A 08/30/2016   Procedure: TRANSURETHRAL RESECTION OF BLADDER TUMOR (TURBT) SMALL;  Surgeon: [name], MD;  Location: [facility];  Service: Urology;  Laterality: N/A;  . TRANSURETHRAL RESECTION OF BLADDER TUMOR N/A 03/14/2017   Procedure: TRANSURETHRAL RESECTION OF BLADDER TUMOR (TURBT)-MEDIUM;  Surgeon: [name], MD;  Location: [facility];  Service: Urology;  Laterality: N/A;  . TRANSURETHRAL RESECTION OF BLADDER  TUMOR WITH MITOMYCIN-C N/A 07/11/2016   Procedure: TRANSURETHRAL RESECTION OF BLADDER TUMOR WITH MITOMYCIN-C;  Surgeon: [name], MD;  Location: [facility];  Service: Urology;  Laterality: N/A;  . TUBAL LIGATION    . VASCULAR SURGERY Left 2004   carotid endarderectomy    Home Medications:  Allergies as of 01/08/2020   No Known Allergies     Medication List       Accurate as of Jan 08, 2020 11:59 PM. If you  have any questions, ask your nurse or doctor.        ALPRAZolam 0.25 MG tablet Commonly known as: XANAX Take 0.25 mg by mouth daily as needed for anxiety.   amLODipine 5 MG tablet Commonly known as: NORVASC Take 5 mg by mouth daily. In the morning   aspirin EC 81 MG tablet Take 81 mg by mouth every evening.   atenolol 50 MG tablet Commonly known as: TENORMIN Take 50 mg by mouth 2 (two) times daily.   B-complex with vitamin C tablet Take 1 tablet by mouth once a week.   Flaxseed Oil 1000 MG Caps Take 1,000 mg by mouth once a week.   folic acid 1 MG tablet Commonly known as: FOLVITE Take 1 tablet by mouth daily.   methotrexate 2.5 MG tablet Commonly known as: RHEUMATREX Take 8 tablets by mouth once a week.   nitrofurantoin (macrocrystal-monohydrate) 100 MG capsule Commonly known as: MACROBID Take 1 capsule (100 mg total) by mouth every 12 (twelve) hours.   nortriptyline 25 MG capsule Commonly known as: PAMELOR Take 25 mg by mouth at bedtime.   predniSONE 5 MG tablet Commonly known as: DELTASONE Take 1 tablet by mouth daily.   simvastatin 10 MG tablet Commonly known as: ZOCOR Take 10 mg by mouth at bedtime.   vitamin B-12 1000 MCG tablet Commonly known as: CYANOCOBALAMIN Take 1,000 mcg by mouth once a week.   vitamin B-6 250 MG tablet Take 250 mg by mouth once a week.       Allergies: No Known Allergies  Family History: Family History  Problem Relation Age of Onset  . Cancer Father        lung cancer - deceased  . Cancer Sister 28       breast cancer - alive and well  . Breast cancer Sister 20  . Breast cancer Paternal Aunt   . Colon cancer Neg Hx   . Ovarian cancer Neg Hx   . Hematuria Neg Hx   . Prostate cancer Neg Hx   . Bladder Cancer Neg Hx     Social History:  reports that she quit smoking about 13 years ago. Her smoking use included cigarettes. She smoked 0.50 packs per day. She has never used smokeless tobacco. She reports that she  does not drink alcohol or use drugs.  ROS: Pertinent ROS in HPI  Physical Exam: BP (!) 151/72   Pulse 81   Ht 5\' 4"  (1.626 m)   Wt 142 lb (64.4 kg)   BMI 24.37 kg/m   Constitutional:  Well nourished. Alert and oriented, No acute distress. HEENT: [place] AT, mask in place.  Trachea midline Cardiovascular: No clubbing, cyanosis, or edema. Respiratory: Normal respiratory effort, no increased work of breathing. Neurologic: Grossly intact, no focal deficits, moving all 4 extremities. Psychiatric: Normal mood and affect.   Laboratory Data: Lab Results  Component Value Date   WBC 8.0 04/09/2018   HGB 14.6 04/09/2018   HCT 42.6 04/09/2018   MCV 93.1  04/09/2018   PLT 219 04/09/2018    Lab Results  Component Value Date   CREATININE 0.64 04/09/2018    Lab Results  Component Value Date   AST 35 11/01/2017   Lab Results  Component Value Date   ALT 30 11/01/2017    Urinalysis Component     Latest Ref Rng & Units 01/08/2020          Specific Gravity, UA     1.005 - 1.030 1.015  pH, UA     5.0 - 7.5 7.0  Color, UA     Yellow Yellow  Appearance Ur     Clear Clear  Leukocytes,UA     Negative Negative  Protein,UA     Negative/Trace Negative  Glucose, UA     Negative Negative  Ketones, UA     Negative Negative  RBC, UA     Negative 1+ (A)  Bilirubin, UA     Negative Negative  Urobilinogen, Ur     0.2 - 1.0 mg/dL 0.2  Nitrite, UA     Negative Negative  Microscopic Examination      See below:   Component     Latest Ref Rng & Units 01/08/2020          WBC, UA     0 - 5 /hpf 6-10 (A)  RBC     0 - 2 /hpf 3-10 (A)  Epithelial Cells (non renal)     0 - 10 /hpf 0-10  Bacteria, UA     None seen/Few None seen    I have reviewed the labs.   Pertinent Imaging: Results for ADAMAE, GRUIS (MRN [id number]) as of 01/05/2020 13:52  Ref. Range 01/05/2020 13:08  Scan Result Unknown 14 ml   In and Out Catheterization Patient is present today for a I & O  catheterization due to contaminated urine culture. Patient was cleaned and prepped in a sterile fashion with betadine . A 14 FR cath was inserted no complications were noted , 60 ml of urine return was noted, urine was yellow in color. A clean urine sample was collected for urine culture. Bladder was drained  And catheter was removed with out difficulty.    Performed by: Kyra Manges, CMA  Assessment & Plan:    1. Dysuria -Likely secondary to a urinary tract infection - BLADDER SCAN AMB NON-IMAGING - Urine Culture; Future - Urinalysis, Complete w Microscopic; Future - Continue Macrobid 100 mg, twice daily x7 days -We will adjust if necessary once culture results are available  2. History of bladder cancer -Has surveillance cystoscopy scheduled for July with Dr. Erlene Quan  Return for Pending urine culture results.  These notes generated with voice recognition software. I apologize for typographical errors.  Zara Council, PA-C  [facility] [address]  [place], [place] [address] [phone]

## 2020-01-09 LAB — URINALYSIS, COMPLETE
Bilirubin, UA: NEGATIVE
Glucose, UA: NEGATIVE
Ketones, UA: NEGATIVE
Leukocytes,UA: NEGATIVE
Nitrite, UA: NEGATIVE
Protein,UA: NEGATIVE
Specific Gravity, UA: 1.015 (ref 1.005–1.030)
Urobilinogen, Ur: 0.2 mg/dL (ref 0.2–1.0)
pH, UA: 7 (ref 5.0–7.5)

## 2020-01-09 LAB — MICROSCOPIC EXAMINATION: Bacteria, UA: NONE SEEN

## 2020-01-11 LAB — CULTURE, URINE COMPREHENSIVE

## 2020-01-12 ENCOUNTER — Telehealth: Payer: Self-pay | Admitting: Family Medicine

## 2020-01-12 NOTE — Telephone Encounter (Signed)
-----   Message from Nori Riis, PA-C sent at 01/12/2020  8:54 AM EDT ----- Please let Kim Espinoza know that her urine culture was negative, so the Macrobid was likely the correct antibiotic.  I would like to recheck her urine in three weeks to make sure the microscopic blood has cleared.

## 2020-01-12 NOTE — Telephone Encounter (Signed)
Patient notified and a Lab appointment has been made for a UA to check for hematuria.

## 2020-02-04 ENCOUNTER — Other Ambulatory Visit: Payer: Self-pay

## 2020-02-04 DIAGNOSIS — R3129 Other microscopic hematuria: Secondary | ICD-10-CM

## 2020-02-05 ENCOUNTER — Encounter: Payer: Self-pay | Admitting: Urology

## 2020-02-05 ENCOUNTER — Other Ambulatory Visit: Payer: Self-pay

## 2020-02-05 ENCOUNTER — Ambulatory Visit: Payer: Medicare Other | Admitting: Urology

## 2020-02-05 VITALS — BP 159/76 | HR 81 | Ht 64.0 in | Wt 142.0 lb

## 2020-02-05 DIAGNOSIS — R3129 Other microscopic hematuria: Secondary | ICD-10-CM

## 2020-02-05 DIAGNOSIS — N952 Postmenopausal atrophic vaginitis: Secondary | ICD-10-CM

## 2020-02-05 DIAGNOSIS — R300 Dysuria: Secondary | ICD-10-CM | POA: Diagnosis not present

## 2020-02-05 DIAGNOSIS — Z8551 Personal history of malignant neoplasm of bladder: Secondary | ICD-10-CM | POA: Diagnosis not present

## 2020-02-05 MED ORDER — PREMARIN 0.625 MG/GM VA CREA: TOPICAL_CREAM | VAGINAL | 12 refills | Status: DC

## 2020-02-05 NOTE — Progress Notes (Signed)
02/05/2020 4:21 PM   Kim Espinoza [birth year] [id number]  Referring provider: [name], MD [address] [facility] [place],  [place] [address]  Chief Complaint  Patient presents with  . Dysuria    HPI: Kim Espinoza is a 79 year old female with history of bladder cancer who presents today for a recheck on her urine for micro heme, but she requested an urgent appointment for pain.  She has a history of bladder cancer.- see cystoscopy note dated 08/26/2019.  She is scheduled for a surveillance cystoscopy 02/24/2020 with Dr. Erlene Quan.  She presented on 01/05/2020, she was experiencing frequency, urgency and dysuria that has been present for 1 1/2 weeks.   She was hoping her symptoms would be self-limiting, but they have persisted.  Patient denies any modifying or aggravating factors.  Patient denies any gross hematuria, dysuria or suprapubic/flank pain.  Patient denies any fevers, chills, nausea or vomiting.   UA yellow clear, trace leukocytes, 0-5 squamous epithelial cells, 6-10 WBCs, 6-10 RBCs and rare bacteria.  Her PVR is 14 mL.  Her urine culture grew out multiple species.  On 01/08/2020, she presented for a CATH UA as the previous culture grew out multiple species.  She was still some what symptomatic, but she was in the early course of her antibiotic.  She continued her nitrofurantoin 100 mg twice daily.  Her CATH UA with 6-10 WBCs and 3-10 RBCs.  Her urine culture was negative.  She presented today for a recheck on her urine to ensure the micro heme has cleared, but she is still experiencing frequency, dysuria and lower abdominal pain.  She feels like the urine is hitting a raw area. She is also experiencing urgency.  Patient denies any modifying or aggravating factors.  Patient denies any gross hematuria or flank pain.  Patient denies any fevers, chills, nausea or vomiting.  Her UA today is positive for micro heme.    PMH: Past Medical History:  Diagnosis Date  .  Abnormal mammogram 2011, 2012   Right side.   . Anxiety   . Arthritis    fingers  . Breast screening, unspecified   . Cancer ([place]) 2018   "bladder stage 1"  . Hypercholesterolemia since 2001  . Hypertension    since approx. 2001  . Mammographic microcalcification   . Personal history of tobacco use, presenting hazards to health   . Special screening for malignant neoplasms, colon   . 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 BREAST BIOPSY Right ??   x2 - core w/clip - neg  . BREAST BIOPSY-right  2011   Stereotactic - Dr. Bary Castilla- Benign breast tissue containing stromal calcifications wiithin.  Periductal Fibrosis.  [facility] BREAST SURGERY Right 2011, 2013   stero biopsy  . CAROTID ENDARTERECTOMY  2004  . CATARACT EXTRACTION EXTRACAPSULAR  2013   Left eye  . CATARACT EXTRACTION W/PHACO Right 10/28/2018   Procedure: CATARACT EXTRACTION PHACO AND INTRAOCULAR LENS PLACEMENT ([place]) RIGHT;  Surgeon: Eulogio Bear, MD;  Location: [facility];  Service: Ophthalmology;  Laterality: Right;  . COLONOSCOPY  2012   Dr. Bary Castilla  . CYSTOSCOPY W/ RETROGRADES Bilateral 04/17/2018   Procedure: CYSTOSCOPY WITH RETROGRADE PYELOGRAM;  Surgeon: [name], MD;  Location: [facility];  Service: Urology;  Laterality: Bilateral;  . CYSTOSCOPY WITH BIOPSY N/A 04/17/2018   Procedure: CYSTOSCOPY WITH Bladder BIOPSY;  Surgeon: [name], MD;  Location: [facility];  Service: Urology;  Laterality: N/A;  . EYE SURGERY Left    cataract   . TONSILLECTOMY    . TRANSURETHRAL RESECTION OF BLADDER TUMOR N/A 08/30/2016   Procedure: TRANSURETHRAL RESECTION OF BLADDER TUMOR (TURBT) SMALL;  Surgeon: [name], MD;  Location: [facility];  Service: Urology;  Laterality: N/A;  . TRANSURETHRAL RESECTION OF BLADDER TUMOR N/A 03/14/2017   Procedure: TRANSURETHRAL RESECTION OF BLADDER TUMOR (TURBT)-MEDIUM;  Surgeon: [name], MD;  Location:  [facility];  Service: Urology;  Laterality: N/A;  . TRANSURETHRAL RESECTION OF BLADDER TUMOR WITH MITOMYCIN-C N/A 07/11/2016   Procedure: TRANSURETHRAL RESECTION OF BLADDER TUMOR WITH MITOMYCIN-C;  Surgeon: [name], MD;  Location: [facility];  Service: Urology;  Laterality: N/A;  . TUBAL LIGATION    . VASCULAR SURGERY Left 2004   carotid endarderectomy    Home Medications:  Allergies as of 02/05/2020   No Known Allergies     Medication List       Accurate as of February 05, 2020  4:21 PM. If you have any questions, ask your nurse or doctor.        STOP taking these medications   nitrofurantoin (macrocrystal-monohydrate) 100 MG capsule Commonly known as: MACROBID Stopped by: Zara Council, PA-C     TAKE these medications   ALPRAZolam 0.25 MG tablet Commonly known as: XANAX Take 0.25 mg by mouth daily as needed for anxiety.   amLODipine 5 MG tablet Commonly known as: NORVASC Take 5 mg by mouth daily. In the morning   aspirin EC 81 MG tablet Take 81 mg by mouth every evening.   atenolol 50 MG tablet Commonly known as: TENORMIN Take 50 mg by mouth 2 (two) times daily.   B-complex with vitamin C tablet Take 1 tablet by mouth once a week.   Flaxseed Oil 1000 MG Caps Take 1,000 mg by mouth once a week.   folic acid 1 MG tablet Commonly known as: FOLVITE Take 1 tablet by mouth daily.   methotrexate 2.5 MG tablet Commonly known as: RHEUMATREX Take 8 tablets by mouth once a week.   nortriptyline 25 MG capsule Commonly known as: PAMELOR Take 25 mg by mouth at bedtime.   predniSONE 5 MG tablet Commonly known as: DELTASONE Take 1 tablet by mouth daily.   Premarin vaginal cream Generic drug: conjugated estrogens Apply 0.5mg  (pea-sized amount)  just inside the vaginal introitus with a finger-tip on  Monday, Wednesday and Friday nights. Started by: Zara Council, PA-C   simvastatin 10 MG tablet Commonly known as: ZOCOR Take 10 mg by mouth at bedtime.     vitamin B-12 1000 MCG tablet Commonly known as: CYANOCOBALAMIN Take 1,000 mcg by mouth once a week.   vitamin B-6 250 MG tablet Take 250 mg by mouth once a week.       Allergies: No Known Allergies  Family History: Family History  Problem Relation Age of Onset  . Cancer Father        lung cancer - deceased  . Cancer Sister 31       breast cancer - alive and well  . Breast cancer Sister 89  . Breast cancer Paternal Aunt   . Colon cancer Neg Hx   . Ovarian cancer Neg Hx   . Hematuria Neg Hx   . Prostate cancer Neg Hx   . Bladder Cancer Neg Hx     Social History:  reports that she quit smoking about 13 years ago. Her smoking use included cigarettes. She  smoked 0.50 packs per day. She has never used smokeless tobacco. She reports that she does not drink alcohol and does not use drugs.  ROS: Pertinent ROS in HPI  Physical Exam: BP (!) 159/76   Pulse 81   Ht 5\' 4"  (1.626 m)   Wt 142 lb (64.4 kg)   BMI 24.37 kg/m   Constitutional:  Well nourished. Alert and oriented, No acute distress. HEENT: [place] AT, mask in place.  Trachea midline Cardiovascular: No clubbing, cyanosis, or edema. Respiratory: Normal respiratory effort, no increased work of breathing. GI: Abdomen is soft, non tender, non distended, no abdominal masses.  GU: No CVA tenderness.  No bladder fullness or masses.  Atrophic external genitalia, sparse pubic hair distribution, no lesions.  Normal urethral meatus, no lesions, no prolapse, no discharge.   Urethral caruncle noted.  No bladder fullness, tenderness or masses. Tender on the base of the introitus.  Pale vagina mucosa, poor estrogen effect, no discharge, no lesions, fair pelvic support, grade II cystocele and no rectocele noted.  No cervical motion tenderness.  Uterus is freely mobile and non-fixed.  No adnexal/parametria masses or tenderness noted.  Anus and perineum are without rashes or lesions.     Skin: No rashes, bruises or suspicious lesions. Lymph: No  cervical or inguinal adenopathy. Neurologic: Grossly intact, no focal deficits, moving all 4 extremities. Psychiatric: Normal mood and affect.   Laboratory Data: Lab Results  Component Value Date   WBC 8.0 04/09/2018   HGB 14.6 04/09/2018   HCT 42.6 04/09/2018   MCV 93.1 04/09/2018   PLT 219 04/09/2018    Lab Results  Component Value Date   CREATININE 0.64 04/09/2018    Lab Results  Component Value Date   AST 35 11/01/2017   Lab Results  Component Value Date   ALT 30 11/01/2017    Urinalysis Component     Latest Ref Rng & Units 02/05/2020          Specific Gravity, UA     1.005 - 1.030 1.010  pH, UA     5.0 - 7.5 6.5  Color, UA     Yellow Yellow  Appearance Ur     Clear Clear  Leukocytes,UA     Negative Negative  Protein,UA     Negative/Trace Negative  Glucose, UA     Negative Negative  Ketones, UA     Negative Negative  RBC, UA     Negative Trace (A)  Bilirubin, UA     Negative Negative  Urobilinogen, Ur     0.2 - 1.0 mg/dL 0.2  Nitrite, UA     Negative Negative  Microscopic Examination      See below:   Component     Latest Ref Rng & Units 02/05/2020          WBC, UA     0 - 5 /hpf 0-5  RBC     0 - 2 /hpf 3-10 (A)  Epithelial Cells (non renal)     0 - 10 /hpf 0-10  Bacteria, UA     None seen/Few Few    I have reviewed the labs.   Assessment & Plan:    1. Microscopic hematuria Persists in spite of antibiotic treatment for possible UTI Patient is scheduled for cystoscopy  2. Dysuria - UA with micro heme - possible due to vaginal atrophy - will move up cystoscopy appointment as she continues to have micro heme and irritative bladder symptoms  3. History of bladder  cancer -Has surveillance cystoscopy scheduled   4. Vaginal atrophy We will have the patient start applying the vaginal estrogen cream on Monday, Wednesday, Friday evenings Sample given Script sent  If cystoscopy is negative for recurrence of bladder cancer, she will  follow-up in 1 month for symptom recheck  Return for keep cysto appointment with Dr. Erlene Quan.  These notes generated with voice recognition software. I apologize for typographical errors.  Zara Council, PA-C  [facility] [address]  [place], [place] [address] [phone]

## 2020-02-09 LAB — URINALYSIS, COMPLETE
Bilirubin, UA: NEGATIVE
Glucose, UA: NEGATIVE
Ketones, UA: NEGATIVE
Leukocytes,UA: NEGATIVE
Nitrite, UA: NEGATIVE
Protein,UA: NEGATIVE
Specific Gravity, UA: 1.01 (ref 1.005–1.030)
Urobilinogen, Ur: 0.2 mg/dL (ref 0.2–1.0)
pH, UA: 6.5 (ref 5.0–7.5)

## 2020-02-09 LAB — MICROSCOPIC EXAMINATION

## 2020-02-09 NOTE — Progress Notes (Signed)
   02/10/20   CC:  Chief Complaint  Patient presents with  . Cysto    HPI: Kim Espinoza is a 79 y.o. F who presents today for surveillance cystoscopy.   Kim Espinoza was found to have 2 small tumors on the right lateral wall just lateral to the ureteral orifice. Each tumor was approximately 1 cm in size, on a narrow stalk, low-grade appearing during a cystoscopy performed on 06/27/2016 with Dr. Louis Meckel.   She underwent TURBT on 07/11/2016 with Dr. Erlene Quan. 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 It was indeterminate if there was muscularispropria in the specimen.  Repeated TURBT was performed on 08/30/2016 with Dr. Pilar Jarvis. Pathology was negative for residual carcinoma and muscularis propria was present.   She completed a 6 weeks induction course of BCG on 12/07/2016.   She underwent a surveillance cystoscopy with Dr. Pilar Jarvis on 03/01/2017 and a necrotic lesion on the right lateral wallapparent TURBT scar. She also has an erythematous 2-3 cm lesion on the left upper lateral wall concerning for a new neoplasm. There are various erythematous patches at the bladder that arepossibly CIS. TURBT was completed on 03/14/2017 with Dr. Pilar Jarvis. Pathology was benign.   Surveillance cystoscopy completed on 06/21/2017 with Dr. Pilar Jarvis noted Previous TUR sites noted on left and right lateral wall. Similar in appearance from time of negative TURBT in July 2018.  Cytology remained negative.  She completed maintenance BCG 06/2017 x 3.  Most recently, she returned to the operating room on 03/2018 with recurrent erythema on the left lateral bladder wall with some adherent debris.   Surgical pathology consistent with inflamed and focally ulcerative urothelium with focal dystrophic calcification negative for malignancy and atypia.  She also had an area c/w squamous metaplasia.  Bilateral retrograde pyelograms were unremarkable.  Urine cytology negative 10/2018  UA from  02/15/20 revealed 3019 RBC.   She was seen by Kim Espinoza for vaginal atrophy last week. She was prescribe vaginal estrogen cream however did not pick it up.   Today's Vitals   02/10/20 1513  BP: (!) 151/77  Pulse: 83   There is no height or weight on file to calculate BMI. NED. A&Ox3.   No respiratory distress   Abd soft, NT, ND Normal external genitalia with patent urethral meatus  Cystoscopy Procedure Note  Patient identification was confirmed, informed consent was obtained, and patient was prepped using Betadine solution.  Lidocaine jelly was administered per urethral meatus.    Procedure: - Flexible cystoscope introduced, without any difficulty.   - Thorough search of the bladder revealed:    normal urethral meatus    normal urothelium    no stones    no ulcers     no tumors    no urethral polyps    no trabeculation  Stellate scar w/ slight erythema on right lateral bladder wall   - Ureteral orifices were normal in position and appearance.  Post-Procedure: - Patient tolerated the procedure well  Assessment/ Plan:  1. History of bladder cancer No concern for recurrence Urine cytology today Continue q6 month until 5 year mark (2022) NED Next year for surveillance cystoscopy   2. Vaginal atrophy Rx of Estrace sent to pharmacy    No follow-ups on file.  Jamas Lav, am acting as a scribe for Dr. [name],  I have reviewed the above documentation for accuracy and completeness, and I agree with the above.   [name], MD

## 2020-02-10 ENCOUNTER — Other Ambulatory Visit: Payer: Self-pay | Admitting: Urology

## 2020-02-10 ENCOUNTER — Other Ambulatory Visit: Payer: Self-pay

## 2020-02-10 ENCOUNTER — Encounter: Payer: Self-pay | Admitting: Urology

## 2020-02-10 ENCOUNTER — Ambulatory Visit: Payer: Medicare Other | Admitting: Urology

## 2020-02-10 VITALS — BP 151/77 | HR 83

## 2020-02-10 DIAGNOSIS — N952 Postmenopausal atrophic vaginitis: Secondary | ICD-10-CM

## 2020-02-10 DIAGNOSIS — Z8551 Personal history of malignant neoplasm of bladder: Secondary | ICD-10-CM

## 2020-02-10 MED ORDER — ESTRADIOL 0.1 MG/GM VA CREA
TOPICAL_CREAM | VAGINAL | 12 refills | Status: DC
Start: 2020-02-10 — End: 2020-02-10

## 2020-02-10 MED ORDER — ESTRADIOL 0.1 MG/GM VA CREA
TOPICAL_CREAM | VAGINAL | 12 refills | Status: AC
Start: 2020-02-10 — End: ?

## 2020-02-12 LAB — URINALYSIS, COMPLETE
Bilirubin, UA: NEGATIVE
Glucose, UA: NEGATIVE
Ketones, UA: NEGATIVE
Nitrite, UA: NEGATIVE
Protein,UA: NEGATIVE
Specific Gravity, UA: 1.01 (ref 1.005–1.030)
Urobilinogen, Ur: 0.2 mg/dL (ref 0.2–1.0)
pH, UA: 7 (ref 5.0–7.5)

## 2020-02-12 LAB — MICROSCOPIC EXAMINATION

## 2020-02-12 LAB — CYTOLOGY - NON PAP

## 2020-02-16 LAB — PATHOLOGY

## 2020-02-24 ENCOUNTER — Other Ambulatory Visit: Payer: Medicare Other | Admitting: Urology

## 2020-11-02 ENCOUNTER — Other Ambulatory Visit: Payer: Self-pay | Admitting: Family Medicine

## 2020-11-02 DIAGNOSIS — Z1231 Encounter for screening mammogram for malignant neoplasm of breast: Secondary | ICD-10-CM

## 2020-11-22 ENCOUNTER — Other Ambulatory Visit: Payer: Self-pay

## 2020-11-22 ENCOUNTER — Ambulatory Visit
Admission: RE | Admit: 2020-11-22 | Discharge: 2020-11-22 | Disposition: A | Discharge status: Home / Self Care | Payer: Medicare Other | Source: Ambulatory Visit | Attending: Family Medicine | Admitting: Family Medicine

## 2020-11-22 DIAGNOSIS — Z1231 Encounter for screening mammogram for malignant neoplasm of breast: Secondary | ICD-10-CM | POA: Diagnosis not present

## 2021-02-09 ENCOUNTER — Ambulatory Visit: Payer: Medicare Other | Admitting: Urology

## 2021-02-09 ENCOUNTER — Other Ambulatory Visit: Payer: Self-pay

## 2021-02-09 VITALS — BP 156/77 | HR 70 | Ht 64.0 in | Wt 135.0 lb

## 2021-02-09 DIAGNOSIS — Z8551 Personal history of malignant neoplasm of bladder: Secondary | ICD-10-CM | POA: Diagnosis not present

## 2021-02-09 NOTE — Progress Notes (Signed)
   02/10/20   CC:  No chief complaint on file.   HPI: Kim Espinoza is a 80 y.o. F who presents today for annual surveillance cystoscopy.   Ms. Kim Espinoza was found to have 2 small tumors on the right lateral wall just lateral to the ureteral orifice. Each tumor was approximately 1 cm in size, on a narrow stalk, low-grade appearing during a cystoscopy performed on 06/27/2016 with Dr. Louis Meckel.    She underwent TURBT on 07/11/2016 with Dr. Erlene Quan.  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 It was indeterminate if there was muscularis propria in the specimen.   Repeated TURBT was performed on 08/30/2016 with Dr. Pilar Jarvis.  Pathology was negative for residual carcinoma and muscularis propria was present.    She completed a 6 weeks induction course of BCG on 12/07/2016.     She underwent a surveillance cystoscopy with Dr. Pilar Jarvis on 03/01/2017 and a necrotic lesion on the right lateral wall apparent TURBT scar. She also has an erythematous 2-3 cm lesion on the left upper lateral wall concerning for a new neoplasm. There are various erythematous patches at the bladder that are possibly CIS. TURBT was completed on 03/14/2017 with Dr. Pilar Jarvis.  Pathology was benign.     Surveillance cystoscopy completed on 06/21/2017 with Dr. Pilar Jarvis noted Previous TUR sites noted on left and right lateral wall. Similar in appearance from time of negative TURBT in July 2018.  Cytology remained negative.   She completed maintenance BCG 06/2017 x 3.   Most recently, she returned to the operating room on 03/2018 with recurrent erythema on the left lateral bladder wall with some adherent debris.   Surgical pathology consistent with inflamed and focally ulcerative urothelium with focal dystrophic calcification negative for malignancy and atypia.  She also had an area c/w squamous metaplasia.  Bilateral retrograde pyelograms were unremarkable.    There were no vitals filed for this visit.  There is  no height or weight on file to calculate BMI. NED. A&Ox3.   No respiratory distress   Abd soft, NT, ND Normal external genitalia with patent urethral meatus  Cystoscopy Procedure Note  Patient identification was confirmed, informed consent was obtained, and patient was prepped using Betadine solution.  Lidocaine jelly was administered per urethral meatus.    Procedure: - Flexible cystoscope introduced, without any difficulty.   - Thorough search of the bladder revealed:    normal urethral meatus    normal urothelium    no stones    no ulcers     no tumors    no urethral polyps    no trabeculation  Stellate scar w/ slight erythema on right lateral bladder wall - stable  - Ureteral orifices were normal in position and appearance.  Post-Procedure: - Patient tolerated the procedure well  Assessment/ Plan:  1. History of bladder cancer No concern for recurrence  Given that she is not had recurrence of documented malignancy in 5 years, we had a shared decision-making about whether or not to continue to pursue surveillance cystoscopy.  She would like to continue this annually which is reasonable given that cystoscopy is a low risk procedure.  2. Vaginal atrophy Continue Estrace sent to pharmacy    F/u 1 year for cysto  [name], MD

## 2021-02-10 LAB — URINALYSIS, COMPLETE
Bilirubin, UA: NEGATIVE
Glucose, UA: NEGATIVE
Ketones, UA: NEGATIVE
Nitrite, UA: NEGATIVE
Protein,UA: NEGATIVE
Specific Gravity, UA: <1.005 — ABNORMAL LOW (ref 1.005–1.030)
Urobilinogen, Ur: 0.2 mg/dL (ref 0.2–1.0)
pH, UA: 5.5 (ref 5.0–7.5)

## 2021-02-10 LAB — MICROSCOPIC EXAMINATION: Bacteria, UA: NONE SEEN

## 2021-04-06 ENCOUNTER — Encounter: Payer: Self-pay | Admitting: General Practice

## 2021-04-06 NOTE — Progress Notes (Signed)
[place] Psychosocial Distress Screening Spiritual Care  Met with Ailine and her husband in [facility] to introduce [facility] team/resources, reviewing distress screen per protocol.  The patient scored a 1 on the Psychosocial Distress Thermometer which indicates mild distress. Also assessed for distress and other psychosocial needs.   [facility] DISTRESS SCREENING 04/06/2021  Screening Type Initial Screening  Distress experienced in past week (1-10) 1  Referral to support programs Yes   Ms Soots reports minimal distress and no other needs/concerns at this time.   Follow up needed: No. Ms Henner is aware of ongoing [facility] team/programming availability and plans to reach out if needed/desired.   [place], [place], [facility] Pager [phone] Voicemail [phone]

## 2021-06-14 ENCOUNTER — Other Ambulatory Visit: Payer: Self-pay | Admitting: Family Medicine

## 2021-06-14 DIAGNOSIS — R590 Localized enlarged lymph nodes: Secondary | ICD-10-CM

## 2021-06-23 ENCOUNTER — Ambulatory Visit
Admission: RE | Admit: 2021-06-23 | Discharge: 2021-06-23 | Disposition: A | Discharge status: Home / Self Care | Payer: Medicare Other | Source: Ambulatory Visit | Attending: Family Medicine | Admitting: Family Medicine

## 2021-06-23 ENCOUNTER — Other Ambulatory Visit: Payer: Self-pay

## 2021-06-23 DIAGNOSIS — R590 Localized enlarged lymph nodes: Secondary | ICD-10-CM | POA: Insufficient documentation

## 2021-07-05 ENCOUNTER — Other Ambulatory Visit: Payer: Self-pay | Admitting: Family Medicine

## 2021-07-05 ENCOUNTER — Other Ambulatory Visit (HOSPITAL_COMMUNITY): Payer: Self-pay | Admitting: Family Medicine

## 2021-07-05 DIAGNOSIS — R9389 Abnormal findings on diagnostic imaging of other specified body structures: Secondary | ICD-10-CM

## 2021-07-05 DIAGNOSIS — R590 Localized enlarged lymph nodes: Secondary | ICD-10-CM

## 2021-07-28 ENCOUNTER — Ambulatory Visit
Admission: RE | Admit: 2021-07-28 | Discharge: 2021-07-28 | Disposition: A | Discharge status: Home / Self Care | Payer: Medicare Other | Source: Ambulatory Visit | Attending: Family Medicine | Admitting: Family Medicine

## 2021-07-28 ENCOUNTER — Other Ambulatory Visit: Payer: Self-pay

## 2021-07-28 DIAGNOSIS — R590 Localized enlarged lymph nodes: Secondary | ICD-10-CM | POA: Diagnosis not present

## 2021-07-28 DIAGNOSIS — R9389 Abnormal findings on diagnostic imaging of other specified body structures: Secondary | ICD-10-CM | POA: Diagnosis present

## 2021-07-28 LAB — POCT I-STAT CREATININE: Creatinine, Ser: 0.8 mg/dL (ref 0.44–1.00)

## 2021-07-28 MED ORDER — IOHEXOL 300 MG/ML  SOLN
75.0000 mL | Freq: Once | INTRAMUSCULAR | Status: AC | PRN
  Administered 2021-07-28: 75 mL via INTRAVENOUS

## 2021-08-03 ENCOUNTER — Other Ambulatory Visit: Payer: Self-pay

## 2021-08-03 ENCOUNTER — Inpatient Hospital Stay: Payer: Medicare Other | Attending: Oncology | Admitting: Oncology

## 2021-08-03 ENCOUNTER — Inpatient Hospital Stay: Payer: Medicare Other

## 2021-08-03 ENCOUNTER — Encounter: Payer: Self-pay | Admitting: Oncology

## 2021-08-03 VITALS — BP 151/79 | HR 74 | Temp 97.4°F | Wt 134.0 lb

## 2021-08-03 DIAGNOSIS — Z79899 Other long term (current) drug therapy: Secondary | ICD-10-CM | POA: Diagnosis not present

## 2021-08-03 DIAGNOSIS — C7951 Secondary malignant neoplasm of bone: Secondary | ICD-10-CM | POA: Insufficient documentation

## 2021-08-03 DIAGNOSIS — C8338 Diffuse large B-cell lymphoma, lymph nodes of multiple sites: Secondary | ICD-10-CM | POA: Diagnosis present

## 2021-08-03 DIAGNOSIS — C787 Secondary malignant neoplasm of liver and intrahepatic bile duct: Secondary | ICD-10-CM | POA: Insufficient documentation

## 2021-08-03 DIAGNOSIS — I2510 Atherosclerotic heart disease of native coronary artery without angina pectoris: Secondary | ICD-10-CM | POA: Diagnosis not present

## 2021-08-03 DIAGNOSIS — K449 Diaphragmatic hernia without obstruction or gangrene: Secondary | ICD-10-CM | POA: Insufficient documentation

## 2021-08-03 DIAGNOSIS — F419 Anxiety disorder, unspecified: Secondary | ICD-10-CM | POA: Insufficient documentation

## 2021-08-03 DIAGNOSIS — R590 Localized enlarged lymph nodes: Secondary | ICD-10-CM | POA: Insufficient documentation

## 2021-08-03 DIAGNOSIS — I700 Atherosclerosis of aorta: Secondary | ICD-10-CM | POA: Insufficient documentation

## 2021-08-03 DIAGNOSIS — Z801 Family history of malignant neoplasm of trachea, bronchus and lung: Secondary | ICD-10-CM | POA: Diagnosis not present

## 2021-08-03 DIAGNOSIS — R221 Localized swelling, mass and lump, neck: Secondary | ICD-10-CM

## 2021-08-03 DIAGNOSIS — I6523 Occlusion and stenosis of bilateral carotid arteries: Secondary | ICD-10-CM | POA: Diagnosis not present

## 2021-08-03 DIAGNOSIS — Z803 Family history of malignant neoplasm of breast: Secondary | ICD-10-CM | POA: Insufficient documentation

## 2021-08-03 DIAGNOSIS — Z7189 Other specified counseling: Secondary | ICD-10-CM | POA: Insufficient documentation

## 2021-08-03 DIAGNOSIS — J984 Other disorders of lung: Secondary | ICD-10-CM | POA: Insufficient documentation

## 2021-08-03 DIAGNOSIS — M069 Rheumatoid arthritis, unspecified: Secondary | ICD-10-CM | POA: Insufficient documentation

## 2021-08-03 DIAGNOSIS — F17210 Nicotine dependence, cigarettes, uncomplicated: Secondary | ICD-10-CM | POA: Insufficient documentation

## 2021-08-03 LAB — CBC WITH DIFFERENTIAL/PLATELET
Abs Immature Granulocytes: 0.04 10*3/uL (ref 0.00–0.07)
Basophils Absolute: 0.1 10*3/uL (ref 0.0–0.1)
Basophils Relative: 1 %
Eosinophils Absolute: 0.1 10*3/uL (ref 0.0–0.5)
Eosinophils Relative: 2 %
HCT: 41.9 % (ref 36.0–46.0)
Hemoglobin: 14.2 g/dL (ref 12.0–15.0)
Immature Granulocytes: 1 %
Lymphocytes Relative: 23 %
Lymphs Abs: 1.8 10*3/uL (ref 0.7–4.0)
MCH: 32.3 pg (ref 26.0–34.0)
MCHC: 33.9 g/dL (ref 30.0–36.0)
MCV: 95.4 fL (ref 80.0–100.0)
Monocytes Absolute: 0.8 10*3/uL (ref 0.1–1.0)
Monocytes Relative: 11 %
Neutro Abs: 5 10*3/uL (ref 1.7–7.7)
Neutrophils Relative %: 62 %
Platelets: 242 10*3/uL (ref 150–400)
RBC: 4.39 MIL/uL (ref 3.87–5.11)
RDW: 14.5 % (ref 11.5–15.5)
WBC: 7.8 10*3/uL (ref 4.0–10.5)
nRBC: 0 % (ref 0.0–0.2)

## 2021-08-03 LAB — COMPREHENSIVE METABOLIC PANEL
ALT: 18 U/L (ref 0–44)
AST: 27 U/L (ref 15–41)
Albumin: 4.4 g/dL (ref 3.5–5.0)
Alkaline Phosphatase: 79 U/L (ref 38–126)
Anion gap: 10 (ref 5–15)
BUN: 8 mg/dL (ref 8–23)
CO2: 26 mmol/L (ref 22–32)
Calcium: 9.5 mg/dL (ref 8.9–10.3)
Chloride: 98 mmol/L (ref 98–111)
Creatinine, Ser: 0.73 mg/dL (ref 0.44–1.00)
GFR, Estimated: >60 mL/min (ref >60)
Glucose, Bld: 101 mg/dL — ABNORMAL HIGH (ref 70–99)
Potassium: 3.6 mmol/L (ref 3.5–5.1)
Sodium: 134 mmol/L — ABNORMAL LOW (ref 135–145)
Total Bilirubin: 0.8 mg/dL (ref 0.3–1.2)
Total Protein: 8.1 g/dL (ref 6.5–8.1)

## 2021-08-03 LAB — LACTATE DEHYDROGENASE: LDH: 378 U/L — ABNORMAL HIGH (ref 98–192)

## 2021-08-03 LAB — TECHNOLOGIST SMEAR REVIEW: Plt Morphology: ADEQUATE

## 2021-08-03 LAB — HIV ANTIBODY (ROUTINE TESTING W REFLEX): HIV Screen 4th Generation wRfx: NONREACTIVE

## 2021-08-03 NOTE — Progress Notes (Signed)
Hematology/Oncology Consult note [phone] [phone]      Patient Care Team: [name], MD as PCP - General (Family Medicine)  REFERRING PROVIDER: [name], MD  CHIEF COMPLAINTS/REASON FOR VISIT:  Evaluation of left neck mass  HISTORY OF PRESENTING ILLNESS:   Kim Espinoza is a  80 y.o.  female with PMH listed below was seen in consultation at the request of  [name], MD  for evaluation of left neck mass  Patient was accompanied by her daughter [name].  Per patient and family, she felt a left neck mass 8 to 10 weeks ago. 06/23/2021, ultrasound neck soft tissue showed mildly enlarged abnormal appearing left cervical lymph nodes correlates with the palpable abnormality.  Index enlarged left cervical lymph node 1.7 x 0.7 x 1.5 cm.  A second left cervical enlarged lymph node measures 1.9 x 1.5 x 0.7 cm.  There are additional smaller diffusely hypoechoic adjacent lymph nodes. 07/28/2021, CT soft tissue neck with contrast showed large confluent malignant appearing nodal mass in the left neck without identifiable mucosal primary lesion.  The tumor surrounds and a severely compresses the left internal jugular vein.  There is also encasement of the left internal carotid artery. Patient reports that left neck mass has increased in size rapidly within the past few weeks.  She denies any unintentional weight loss, night sweats, fever, dysphagia, shortness of breath.  She has a good appetite.  She smokes a few cigarettes per day.  Patient is on methotrexate for treatment of rheumatoid arthritis.  She takes folic acid.   Review of Systems  Constitutional:  Negative for appetite change, chills, fatigue and fever.  HENT:   Negative for hearing loss and voice change.        Left neck mass  Eyes:  Negative for eye problems.  Respiratory:  Negative for chest tightness and cough.   Cardiovascular:  Negative for chest pain.  Gastrointestinal:  Negative for  abdominal distention, abdominal pain and blood in stool.  Endocrine: Negative for hot flashes.  Genitourinary:  Negative for difficulty urinating and frequency.   Musculoskeletal:  Negative for arthralgias.  Skin:  Negative for itching and rash.  Neurological:  Negative for extremity weakness.  Hematological:  Positive for adenopathy.  Psychiatric/Behavioral:  Negative for confusion.    MEDICAL HISTORY:  Past Medical History:  Diagnosis Date   Abnormal mammogram 2011, 2012   Right side.    Anxiety    Breast screening, unspecified    Cancer ([place]) 2018   "bladder stage 1"   Hypercholesterolemia since 2001   Hypertension    since approx. 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   x2 - core w/clip - neg   BREAST BIOPSY-right  2011   Stereotactic - Dr. Bary Castilla- Benign breast tissue containing stromal calcifications wiithin.  Periductal Fibrosis.   BREAST SURGERY Right 2011, 2013   stero biopsy   CAROTID ENDARTERECTOMY  2004   CATARACT EXTRACTION EXTRACAPSULAR  2013   Left eye   CATARACT EXTRACTION W/PHACO Right 10/28/2018   Procedure: CATARACT EXTRACTION PHACO AND INTRAOCULAR LENS PLACEMENT (IOC) RIGHT;  Surgeon: Eulogio Bear, MD;  Location: [facility];  Service: Ophthalmology;  Laterality:  Right;   COLONOSCOPY  2012   Dr. Bary Castilla   CYSTOSCOPY W/ RETROGRADES Bilateral 04/17/2018   Procedure: CYSTOSCOPY WITH RETROGRADE PYELOGRAM;  Surgeon: [name], MD;  Location: [facility];  Service: Urology;  Laterality: Bilateral;   CYSTOSCOPY WITH BIOPSY N/A 04/17/2018   Procedure: CYSTOSCOPY WITH Bladder BIOPSY;  Surgeon: [name], MD;  Location: [facility];  Service: Urology;  Laterality: N/A;    EYE SURGERY Left    cataract    TONSILLECTOMY     TRANSURETHRAL RESECTION OF BLADDER TUMOR N/A 08/30/2016   Procedure: TRANSURETHRAL RESECTION OF BLADDER TUMOR (TURBT) SMALL;  Surgeon: [name], MD;  Location: [facility];  Service: Urology;  Laterality: N/A;   TRANSURETHRAL RESECTION OF BLADDER TUMOR N/A 03/14/2017   Procedure: TRANSURETHRAL RESECTION OF BLADDER TUMOR (TURBT)-MEDIUM;  Surgeon: [name], MD;  Location: [facility];  Service: Urology;  Laterality: N/A;   TRANSURETHRAL RESECTION OF BLADDER TUMOR WITH MITOMYCIN-C N/A 07/11/2016   Procedure: TRANSURETHRAL RESECTION OF BLADDER TUMOR WITH MITOMYCIN-C;  Surgeon: [name], MD;  Location: [facility];  Service: Urology;  Laterality: N/A;   TUBAL LIGATION     VASCULAR SURGERY Left 2004   carotid endarderectomy    SOCIAL HISTORY: Social History   Socioeconomic History   Marital status: Widowed    Spouse name: Not on file   Number of children: Not on file   Years of education: Not on file   Highest education level: Not on file  Occupational History   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    Comment: retired  Tobacco Use   Smoking status: Former    Packs/day: 0.50    Types: Cigarettes    Quit date: 06/29/2006    Years since quitting: 15.1   Smokeless tobacco: Never   Tobacco comments:    smoking a little now since anxious for surgery  Vaping Use   Vaping Use: Never used  Substance and Sexual Activity   Alcohol use: No    Comment: never drinks alcohol   Drug use: No   Sexual activity: Never  Other Topics Concern   Not on file  Social History Narrative   Not on file   Social Determinants of Health   Financial Resource Strain: Not on file  Food Insecurity: Not on file  Transportation Needs: Not on file  Physical Activity: Not on file  Stress: Not on file  Social Connections: Not on file  Intimate Partner Violence: Not on file    FAMILY HISTORY: Family History  Problem Relation Age of Onset    Cancer Father        lung cancer - deceased   Cancer Sister 26       breast cancer - alive and well   Breast cancer Sister 10   Breast cancer Paternal Aunt    Colon cancer Neg Hx    Ovarian cancer Neg Hx    Hematuria Neg Hx    Prostate cancer Neg Hx    Bladder Cancer Neg Hx     ALLERGIES:  has No Known Allergies.  MEDICATIONS:  Current Outpatient Medications  Medication Sig Dispense Refill   ALPRAZolam (XANAX) 0.25 MG tablet Take 0.25 mg by mouth daily as needed for anxiety.     amLODipine (NORVASC) 5 MG tablet Take 5 mg by mouth daily. In the morning     atenolol (TENORMIN) 50 MG tablet Take 50 mg by mouth 2 (two) times daily.     folic acid (FOLVITE) 1 MG tablet Take  1 mg by mouth 2 (two) times daily.     methotrexate (RHEUMATREX) 2.5 MG tablet Take 5 tablets by mouth once a week.     nortriptyline (PAMELOR) 25 MG capsule Take 25 mg by mouth at bedtime.      simvastatin (ZOCOR) 10 MG tablet Take 1 tablet by mouth daily.     aspirin EC 81 MG tablet Take 81 mg by mouth every evening.  (Patient not taking: Reported on 08/03/2021)     B Complex-C (B-COMPLEX WITH VITAMIN C) tablet Take 1 tablet by mouth once a week.  (Patient not taking: Reported on 08/03/2021)     estradiol (ESTRACE) 0.1 MG/GM vaginal cream Apply 1 pea size amount Monday, Wednesday and Friday night. (Patient not taking: Reported on 08/03/2021) 42.5 g 12   Flaxseed, Linseed, (FLAXSEED OIL) 1000 MG CAPS Take 1,000 mg by mouth once a week.  (Patient not taking: Reported on 08/03/2021)     Pyridoxine HCl (VITAMIN B-6) 250 MG tablet Take 250 mg by mouth once a week.  (Patient not taking: Reported on 08/03/2021)     vitamin B-12 (CYANOCOBALAMIN) 1000 MCG tablet Take 1,000 mcg by mouth once a week.  (Patient not taking: Reported on 08/03/2021)     No current facility-administered medications for this visit.     PHYSICAL EXAMINATION: ECOG PERFORMANCE STATUS: 1 - Symptomatic but completely ambulatory Vitals:   08/03/21  1523  BP: (!) 151/79  Pulse: 74  Temp: (!) 97.4 F (36.3 C)   Filed Weights   08/03/21 1523  Weight: 134 lb (60.8 kg)    Physical Exam Constitutional:      General: She is not in acute distress. HENT:     Head: Normocephalic and atraumatic.  Eyes:     General: No scleral icterus. Neck:     Comments: Bulky left cervical lymphadenopathy Cardiovascular:     Rate and Rhythm: Normal rate and regular rhythm.     Heart sounds: Normal heart sounds.  Pulmonary:     Effort: Pulmonary effort is normal. No respiratory distress.     Breath sounds: No wheezing.  Abdominal:     General: Bowel sounds are normal. There is no distension.     Palpations: Abdomen is soft.  Musculoskeletal:        General: No deformity. Normal range of motion.     Cervical back: Normal range of motion and neck supple.  Lymphadenopathy:     Cervical: Cervical adenopathy present.  Skin:    General: Skin is warm and dry.     Findings: No erythema or rash.  Neurological:     Mental Status: She is alert and oriented to person, place, and time. Mental status is at baseline.     Cranial Nerves: No cranial nerve deficit.     Coordination: Coordination normal.  Psychiatric:        Mood and Affect: Mood normal.    LABORATORY DATA:  I have reviewed the data as listed Lab Results  Component Value Date   WBC 7.8 08/03/2021   HGB 14.2 08/03/2021   HCT 41.9 08/03/2021   MCV 95.4 08/03/2021   PLT 242 08/03/2021   Recent Labs    07/28/21 1030 08/03/21 1616  NA  --  134*  K  --  3.6  CL  --  98  CO2  --  26  GLUCOSE  --  101*  BUN  --  8  CREATININE 0.80 0.73  CALCIUM  --  9.5  GFRNONAA  --  >  60  PROT  --  8.1  ALBUMIN  --  4.4  AST  --  27  ALT  --  18  ALKPHOS  --  79  BILITOT  --  0.8   Iron/TIBC/Ferritin/ %Sat No results found for: IRON, TIBC, FERRITIN, IRONPCTSAT    RADIOGRAPHIC STUDIES: I have personally reviewed the radiological images as listed and agreed with the findings in the  report. CT SOFT TISSUE NECK W CONTRAST  Result Date: 07/28/2021 CLINICAL DATA:  Left submandibular swelling over the last 2 months. EXAM: CT NECK WITH CONTRAST TECHNIQUE: Multidetector CT imaging of the neck was performed using the standard protocol following the bolus administration of intravenous contrast. CONTRAST:  32m OMNIPAQUE IOHEXOL 300 MG/ML  SOLN COMPARISON:  Ultrasound 06/23/2021 FINDINGS: Pharynx and larynx: I cannot identify a primary mucosal mass lesion. Salivary glands: Parotid and submandibular glands are normal. Thyroid: Normal except for a few scattered small nodules, the largest on the left measuring 1 cm in size. Lymph nodes: No lymphadenopathy on the right. Large confluent malignant appearing nodal mass of the left neck measuring up to 7.2 cm in size. The mass surrounds the left internal jugular vein and narrows the lumen to a diameter of 2 mm, but flow does persist. The mass also encases the left internal carotid artery. Vascular: Atherosclerotic disease at both carotid bifurcations. See above discussion regarding tumor encasement of vessels in the left neck. Limited intracranial: Normal Visualized orbits: Normal Mastoids and visualized paranasal sinuses: Clear Skeleton: Chronic degenerative spondylosis. Upper chest: Pleural and parenchymal scarring at both lung apices. No lung mass or nodule in the region studied. Other: None IMPRESSION: Up to 7 cm in diameter malignant-appearing nodal mass in the left neck without identifiable mucosal primary lesion. The tumor surrounds and severely compresses the left internal jugular vein. There is also encasement of the left internal carotid artery. Electronically Signed   By: MNelson ChimesM.D.   On: 07/28/2021 15:44      ASSESSMENT & PLAN:  1. Mass in neck   2. Goals of care, counseling/discussion    #Rapid growing left neck mass/cervical lymphadenopathy. Images were independently viewed by me and discussed with the patient and her daughter.   I recommend tissue diagnosis with IR biopsy of the palpable left neck mass. Top differential diagnosis includes lymphoma, head neck cancer [although no mucosal primary lesion was discussed on the CT], etc. Check CBC, CMP, peripheral blood flow cytometry, hepatitis panel, HIV, myeloma panel, smear. Obtain PET scan initial staging. Patient and daughter agree with the plan. Orders Placed This Encounter  Procedures   CBC with Differential/Platelet    Standing Status:   Future    Number of Occurrences:   1    Standing Expiration Date:   08/03/2022   Comprehensive metabolic panel    Standing Status:   Future    Number of Occurrences:   1    Standing Expiration Date:   08/03/2022   Kappa/lambda light chains    Standing Status:   Future    Number of Occurrences:   1    Standing Expiration Date:   08/03/2022   Multiple Myeloma Panel (SPEP&IFE w/QIG)    Standing Status:   Future    Number of Occurrences:   1    Standing Expiration Date:   08/03/2022   Flow cytometry panel-leukemia/lymphoma work-up    Standing Status:   Future    Number of Occurrences:   1    Standing Expiration Date:  08/03/2022   Technologist smear review    Standing Status:   Future    Number of Occurrences:   1    Standing Expiration Date:   08/03/2022   Lactate dehydrogenase    Standing Status:   Future    Number of Occurrences:   1    Standing Expiration Date:   08/03/2022   HIV Antibody (routine testing w rflx)    Standing Status:   Future    Number of Occurrences:   1    Standing Expiration Date:   08/03/2022   Hepatitis panel, acute    Standing Status:   Future    Number of Occurrences:   1    Standing Expiration Date:   08/03/2022    All questions were answered. The patient knows to call the clinic with any problems questions or concerns.  cc [name], MD    Return of visit: Follow-up 1 week after biopsy. Thank you for this kind referral and the opportunity to participate in the care of this  patient. A copy of today's note is routed to referring provider   Earlie Server, MD, PhD [facility] Hematology Oncology 08/03/2021

## 2021-08-04 ENCOUNTER — Encounter: Payer: Self-pay | Admitting: Oncology

## 2021-08-04 LAB — KAPPA/LAMBDA LIGHT CHAINS
Kappa free light chain: 32.7 mg/L — ABNORMAL HIGH (ref 3.3–19.4)
Kappa, lambda light chain ratio: 1.42 (ref 0.26–1.65)
Lambda free light chains: 23 mg/L (ref 5.7–26.3)

## 2021-08-04 LAB — HEPATITIS PANEL, ACUTE
HCV Ab: NONREACTIVE
Hep A IgM: NONREACTIVE
Hep B C IgM: NONREACTIVE
Hepatitis B Surface Ag: NONREACTIVE

## 2021-08-06 LAB — COMP PANEL: LEUKEMIA/LYMPHOMA

## 2021-08-08 ENCOUNTER — Ambulatory Visit
Admission: RE | Admit: 2021-08-08 | Discharge: 2021-08-08 | Disposition: A | Discharge status: Home / Self Care | Payer: Medicare Other | Source: Ambulatory Visit | Attending: Oncology | Admitting: Oncology

## 2021-08-08 DIAGNOSIS — I700 Atherosclerosis of aorta: Secondary | ICD-10-CM | POA: Diagnosis not present

## 2021-08-08 DIAGNOSIS — I2510 Atherosclerotic heart disease of native coronary artery without angina pectoris: Secondary | ICD-10-CM | POA: Diagnosis not present

## 2021-08-08 DIAGNOSIS — C7951 Secondary malignant neoplasm of bone: Secondary | ICD-10-CM | POA: Insufficient documentation

## 2021-08-08 DIAGNOSIS — R221 Localized swelling, mass and lump, neck: Secondary | ICD-10-CM | POA: Insufficient documentation

## 2021-08-08 DIAGNOSIS — K449 Diaphragmatic hernia without obstruction or gangrene: Secondary | ICD-10-CM | POA: Insufficient documentation

## 2021-08-08 DIAGNOSIS — C787 Secondary malignant neoplasm of liver and intrahepatic bile duct: Secondary | ICD-10-CM | POA: Insufficient documentation

## 2021-08-08 LAB — GLUCOSE, CAPILLARY: Glucose-Capillary: 93 mg/dL (ref 70–99)

## 2021-08-08 MED ORDER — FLUDEOXYGLUCOSE F - 18 (FDG) INJECTION
6.9000 | Freq: Once | INTRAVENOUS | Status: AC | PRN
  Administered 2021-08-08: 10:00:00 7.55 via INTRAVENOUS

## 2021-08-10 ENCOUNTER — Encounter: Payer: Self-pay | Admitting: Oncology

## 2021-08-10 LAB — MULTIPLE MYELOMA PANEL, SERUM
Albumin SerPl Elph-Mcnc: 3.9 g/dL (ref 2.9–4.4)
Albumin/Glob SerPl: 1.2 (ref 0.7–1.7)
Alpha 1: 0.3 g/dL (ref 0.0–0.4)
Alpha2 Glob SerPl Elph-Mcnc: 0.7 g/dL (ref 0.4–1.0)
B-Globulin SerPl Elph-Mcnc: 1.3 g/dL (ref 0.7–1.3)
Gamma Glob SerPl Elph-Mcnc: 1.1 g/dL (ref 0.4–1.8)
Globulin, Total: 3.4 g/dL (ref 2.2–3.9)
IgA: 414 mg/dL (ref 64–422)
IgG (Immunoglobin G), Serum: 1215 mg/dL (ref 586–1602)
IgM (Immunoglobulin M), Srm: 80 mg/dL (ref 26–217)
Total Protein ELP: 7.3 g/dL (ref 6.0–8.5)

## 2021-08-10 NOTE — Telephone Encounter (Signed)
Please advise 

## 2021-08-11 ENCOUNTER — Telehealth: Payer: Self-pay | Admitting: *Deleted

## 2021-08-11 ENCOUNTER — Other Ambulatory Visit: Payer: Self-pay | Admitting: Radiology

## 2021-08-11 NOTE — Telephone Encounter (Signed)
FMLA received on 08/11/21 at 1030 am. Reviewed chart. Unable to finish completing form at this time as a definitive dx and plan of care is not yet known. Dr. Collie Siad team aware. I have message daughter via Deloris Ping to confirm that documents are received but pending completion until dx is known. Biopsy is schedule on 08/12/21.

## 2021-08-12 ENCOUNTER — Other Ambulatory Visit: Payer: Self-pay

## 2021-08-12 ENCOUNTER — Ambulatory Visit
Admission: RE | Admit: 2021-08-12 | Discharge: 2021-08-12 | Disposition: A | Discharge status: Home / Self Care | Payer: Medicare Other | Source: Ambulatory Visit | Attending: Oncology | Admitting: Oncology

## 2021-08-12 DIAGNOSIS — R221 Localized swelling, mass and lump, neck: Secondary | ICD-10-CM | POA: Diagnosis present

## 2021-08-12 DIAGNOSIS — C8331 Diffuse large B-cell lymphoma, lymph nodes of head, face, and neck: Secondary | ICD-10-CM | POA: Insufficient documentation

## 2021-08-12 NOTE — Procedures (Signed)
Pre Procedure Dx: Left sided neck mass Post Procedural Dx: Same  Technically successful US guided biopsy of hypermetabolic left sided neck mass.   EBL: None  No immediate complications.   Ronny Bacon, MD Pager #: [phone]

## 2021-08-16 ENCOUNTER — Ambulatory Visit: Payer: Medicare Other

## 2021-08-17 ENCOUNTER — Other Ambulatory Visit: Payer: Self-pay | Admitting: Oncology

## 2021-08-17 ENCOUNTER — Encounter: Payer: Self-pay | Admitting: Oncology

## 2021-08-17 MED ORDER — ONDANSETRON HCL 4 MG PO TABS: 4.0000 mg | ORAL_TABLET | Freq: Four times a day (QID) | ORAL | 0 refills | Status: AC | PRN

## 2021-08-17 NOTE — Telephone Encounter (Signed)
Please advise 

## 2021-08-17 NOTE — Telephone Encounter (Signed)
08/17/21- RN Reviewed chart- path still pending

## 2021-08-18 ENCOUNTER — Other Ambulatory Visit: Payer: Self-pay | Admitting: Oncology

## 2021-08-18 ENCOUNTER — Encounter: Payer: Self-pay | Admitting: Oncology

## 2021-08-18 ENCOUNTER — Other Ambulatory Visit: Payer: Self-pay | Admitting: Emergency Medicine

## 2021-08-18 DIAGNOSIS — R221 Localized swelling, mass and lump, neck: Secondary | ICD-10-CM

## 2021-08-18 MED ORDER — DEXAMETHASONE 4 MG PO TABS: 20.0000 mg | ORAL_TABLET | Freq: Every day | ORAL | 0 refills | Status: AC

## 2021-08-19 ENCOUNTER — Encounter: Payer: Self-pay | Admitting: *Deleted

## 2021-08-19 ENCOUNTER — Inpatient Hospital Stay: Payer: Medicare Other | Admitting: Oncology

## 2021-08-19 ENCOUNTER — Other Ambulatory Visit: Payer: Self-pay

## 2021-08-19 VITALS — BP 138/84 | HR 76 | Temp 98.1°F | Resp 18 | Wt 133.8 lb

## 2021-08-19 DIAGNOSIS — C8338 Diffuse large B-cell lymphoma, lymph nodes of multiple sites: Secondary | ICD-10-CM | POA: Diagnosis not present

## 2021-08-19 DIAGNOSIS — Z7189 Other specified counseling: Secondary | ICD-10-CM | POA: Diagnosis not present

## 2021-08-19 DIAGNOSIS — R221 Localized swelling, mass and lump, neck: Secondary | ICD-10-CM

## 2021-08-19 DIAGNOSIS — C8591 Non-Hodgkin lymphoma, unspecified, lymph nodes of head, face, and neck: Secondary | ICD-10-CM

## 2021-08-19 NOTE — Progress Notes (Signed)
Patient here for follow up. Pt reports that appetite has not been as good since she has had the neck biopsy. Pt also reports soreness to neck.

## 2021-08-19 NOTE — Progress Notes (Signed)
Hematology/Oncology Consult note [phone] [phone]      Patient Care Team: [name], MD as PCP - General (Family Medicine) Earlie Server, MD as Consulting Physician (Hematology and Oncology)  REFERRING PROVIDER: [name], MD  CHIEF COMPLAINTS/REASON FOR VISIT:  Evaluation of left neck mass  HISTORY OF PRESENTING ILLNESS:   Kim Espinoza is a  80 y.o.  female with PMH listed below was seen in consultation at the request of  [name], MD  for evaluation of left neck mass  Patient was accompanied by her daughter Kim Espinoza.  Per patient and family, she felt a left neck mass 8 to 10 weeks ago. 06/23/2021, ultrasound neck soft tissue showed mildly enlarged abnormal appearing left cervical lymph nodes correlates with the palpable abnormality.  Index enlarged left cervical lymph node 1.7 x 0.7 x 1.5 cm.  A second left cervical enlarged lymph node measures 1.9 x 1.5 x 0.7 cm.  There are additional smaller diffusely hypoechoic adjacent lymph nodes. 07/28/2021, CT soft tissue neck with contrast showed large confluent malignant appearing nodal mass in the left neck without identifiable mucosal primary lesion.  The tumor surrounds and a severely compresses the left internal jugular vein.  There is also encasement of the left internal carotid artery. Patient reports that left neck mass has increased in size rapidly within the past few weeks.  She denies any unintentional weight loss, night sweats, fever, dysphagia, shortness of breath.  She has a good appetite.  She smokes a few cigarettes per day.  Patient is on methotrexate for treatment of rheumatoid arthritis.  She takes folic acid.   INTERVAL HISTORY Kim Espinoza is a 80 y.o. female who has above history reviewed by me today presents for follow up visit for management of newly diagnosed diffuse large B-cell lymphoma. 08/08/2021, PET scan showed hypermetabolic left neck mass, multiple hepatic metastasis,  widespread osseous metastasis. 08/12/2021, left neck lymph node biopsy showed diffuse large B-cell lymphoma, illness, CD30 positive. BCL-6 IHC positive.  CD30 positive.  FISH studies of Bcl-2, BCL6, MYC are pending. Present to discuss results.  Accompanied by her daughter.  Patient has had decreased oral intake, due to anxiety and lymphoma.  Denies any difficulty swallowing.    Review of Systems  Constitutional:  Negative for appetite change, chills, fatigue and fever.  HENT:   Negative for hearing loss and voice change.        Left neck mass  Eyes:  Negative for eye problems.  Respiratory:  Negative for chest tightness and cough.   Cardiovascular:  Negative for chest pain.  Gastrointestinal:  Negative for abdominal distention, abdominal pain and blood in stool.  Endocrine: Negative for hot flashes.  Genitourinary:  Negative for difficulty urinating and frequency.   Musculoskeletal:  Negative for arthralgias.  Skin:  Negative for itching and rash.  Neurological:  Negative for extremity weakness.  Hematological:  Positive for adenopathy.  Psychiatric/Behavioral:  Negative for confusion.    MEDICAL HISTORY:  Past Medical History:  Diagnosis Date   Abnormal mammogram 2011, 2012   Right side.    Anxiety    Breast screening, unspecified    Cancer ([place]) 2018   "bladder stage 1"   Hypercholesterolemia since 2001   Hypertension    since approx. 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   x2 - core w/clip - neg   BREAST BIOPSY-right  2011   Stereotactic - Dr. Bary Castilla- Benign breast tissue containing stromal calcifications wiithin.  Periductal Fibrosis.   BREAST SURGERY Right 2011, 2013   stero biopsy    CAROTID ENDARTERECTOMY  2004   CATARACT EXTRACTION EXTRACAPSULAR  2013   Left eye   CATARACT EXTRACTION W/PHACO Right 10/28/2018   Procedure: CATARACT EXTRACTION PHACO AND INTRAOCULAR LENS PLACEMENT (IOC) RIGHT;  Surgeon: Eulogio Bear, MD;  Location: [facility];  Service: Ophthalmology;  Laterality: Right;   COLONOSCOPY  2012   Dr. Bary Castilla   CYSTOSCOPY W/ RETROGRADES Bilateral 04/17/2018   Procedure: CYSTOSCOPY WITH RETROGRADE PYELOGRAM;  Surgeon: [name], MD;  Location: [facility];  Service: Urology;  Laterality: Bilateral;   CYSTOSCOPY WITH BIOPSY N/A 04/17/2018   Procedure: CYSTOSCOPY WITH Bladder BIOPSY;  Surgeon: [name], MD;  Location: [facility];  Service: Urology;  Laterality: N/A;   EYE SURGERY Left    cataract    TONSILLECTOMY     TRANSURETHRAL RESECTION OF BLADDER TUMOR N/A 08/30/2016   Procedure: TRANSURETHRAL RESECTION OF BLADDER TUMOR (TURBT) SMALL;  Surgeon: [name], MD;  Location: [facility];  Service: Urology;  Laterality: N/A;   TRANSURETHRAL RESECTION OF BLADDER TUMOR N/A 03/14/2017   Procedure: TRANSURETHRAL RESECTION OF BLADDER TUMOR (TURBT)-MEDIUM;  Surgeon: [name], MD;  Location: [facility];  Service: Urology;  Laterality: N/A;   TRANSURETHRAL RESECTION OF BLADDER TUMOR WITH MITOMYCIN-C N/A 07/11/2016   Procedure: TRANSURETHRAL RESECTION OF BLADDER TUMOR WITH MITOMYCIN-C;  Surgeon: [name], MD;  Location: [facility];  Service: Urology;  Laterality: N/A;   TUBAL LIGATION     VASCULAR SURGERY Left 2004   carotid endarderectomy    SOCIAL HISTORY: Social History   Socioeconomic History   Marital status: Widowed    Spouse name: Not on file   Number of children: Not on file   Years of education: Not on file   Highest education level: Not on file  Occupational History   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    Comment: retired  Tobacco Use   Smoking status: Former    Packs/day: 0.50    Types: Cigarettes    Quit date:  06/29/2006    Years since quitting: 15.1   Smokeless tobacco: Never   Tobacco comments:    smoking a little now since anxious for surgery  Vaping Use   Vaping Use: Never used  Substance and Sexual Activity   Alcohol use: No    Comment: never drinks alcohol   Drug use: No   Sexual activity: Never  Other Topics Concern   Not on file  Social History Narrative   Not on file   Social Determinants of Health   Financial Resource Strain: Not on file  Food Insecurity: Not on file  Transportation Needs: Not on file  Physical Activity: Not on file  Stress: Not on file  Social Connections: Not on file  Intimate Partner Violence: Not on file    FAMILY HISTORY: Family History  Problem Relation Age of Onset   Cancer Father        lung cancer - deceased   Cancer Sister 29       breast cancer - alive and well   Breast cancer Sister 56   Breast cancer Paternal Aunt    Colon cancer Neg  Hx    Ovarian cancer Neg Hx    Hematuria Neg Hx    Prostate cancer Neg Hx    Bladder Cancer Neg Hx     ALLERGIES:  has No Known Allergies.  MEDICATIONS:  Current Outpatient Medications  Medication Sig Dispense Refill   ALPRAZolam (XANAX) 0.25 MG tablet Take 0.25 mg by mouth daily as needed for anxiety.     amLODipine (NORVASC) 5 MG tablet Take 5 mg by mouth daily. In the morning     aspirin EC 81 MG tablet Take 81 mg by mouth every evening.  (Patient not taking: Reported on 08/03/2021)     atenolol (TENORMIN) 50 MG tablet Take 50 mg by mouth 2 (two) times daily.     B Complex-C (B-COMPLEX WITH VITAMIN C) tablet Take 1 tablet by mouth once a week.  (Patient not taking: Reported on 08/03/2021)     dexamethasone (DECADRON) 4 MG tablet Take 5 tablets (20 mg total) by mouth daily. 20 tablet 0   estradiol (ESTRACE) 0.1 MG/GM vaginal cream Apply 1 pea size amount Monday, Wednesday and Friday night. (Patient not taking: Reported on 08/03/2021) 42.5 g 12   Flaxseed, Linseed, (FLAXSEED OIL) 1000 MG CAPS Take  1,000 mg by mouth once a week.  (Patient not taking: Reported on 55/20/8022)     folic acid (FOLVITE) 1 MG tablet Take 1 mg by mouth 2 (two) times daily.     methotrexate (RHEUMATREX) 2.5 MG tablet Take 5 tablets by mouth once a week.     nortriptyline (PAMELOR) 25 MG capsule Take 25 mg by mouth at bedtime.      ondansetron (ZOFRAN) 4 MG tablet Take 1 tablet (4 mg total) by mouth every 6 (six) hours as needed for nausea or vomiting. 45 tablet 0   Pyridoxine HCl (VITAMIN B-6) 250 MG tablet Take 250 mg by mouth once a week.  (Patient not taking: Reported on 08/03/2021)     simvastatin (ZOCOR) 10 MG tablet Take 1 tablet by mouth daily.     vitamin B-12 (CYANOCOBALAMIN) 1000 MCG tablet Take 1,000 mcg by mouth once a week.  (Patient not taking: Reported on 08/03/2021)     No current facility-administered medications for this visit.     PHYSICAL EXAMINATION: ECOG PERFORMANCE STATUS: 1 - Symptomatic but completely ambulatory Vitals:   08/19/21 0912  BP: 138/84  Pulse: 76  Resp: 18  Temp: 98.1 F (36.7 C)   Filed Weights   08/19/21 0912  Weight: 133 lb 12.8 oz (60.7 kg)    Physical Exam Constitutional:      General: She is not in acute distress. HENT:     Head: Normocephalic and atraumatic.  Eyes:     General: No scleral icterus. Neck:     Comments: Bulky left cervical lymphadenopathy Cardiovascular:     Rate and Rhythm: Normal rate and regular rhythm.     Heart sounds: Normal heart sounds.  Pulmonary:     Effort: Pulmonary effort is normal. No respiratory distress.     Breath sounds: No wheezing.  Abdominal:     General: Bowel sounds are normal. There is no distension.     Palpations: Abdomen is soft.  Musculoskeletal:        General: No deformity. Normal range of motion.     Cervical back: Normal range of motion and neck supple.  Lymphadenopathy:     Cervical: Cervical adenopathy present.  Skin:    General: Skin is warm and dry.  Findings: No erythema or rash.   Neurological:     Mental Status: She is alert and oriented to person, place, and time. Mental status is at baseline.     Cranial Nerves: No cranial nerve deficit.     Coordination: Coordination normal.  Psychiatric:        Mood and Affect: Mood normal.    LABORATORY DATA:  I have reviewed the data as listed Lab Results  Component Value Date   WBC 7.8 08/03/2021   HGB 14.2 08/03/2021   HCT 41.9 08/03/2021   MCV 95.4 08/03/2021   PLT 242 08/03/2021   Recent Labs    07/28/21 1030 08/03/21 1616  NA  --  134*  K  --  3.6  CL  --  98  CO2  --  26  GLUCOSE  --  101*  BUN  --  8  CREATININE 0.80 0.73  CALCIUM  --  9.5  GFRNONAA  --  >60  PROT  --  8.1  ALBUMIN  --  4.4  AST  --  27  ALT  --  18  ALKPHOS  --  79  BILITOT  --  0.8    Iron/TIBC/Ferritin/ %Sat No results found for: IRON, TIBC, FERRITIN, IRONPCTSAT   08/03/2021 Hepatitis panel negative, HIV negative.   RADIOGRAPHIC STUDIES: I have personally reviewed the radiological images as listed and agreed with the findings in the report. CT SOFT TISSUE NECK W CONTRAST  Result Date: 07/28/2021 CLINICAL DATA:  Left submandibular swelling over the last 2 months. EXAM: CT NECK WITH CONTRAST TECHNIQUE: Multidetector CT imaging of the neck was performed using the standard protocol following the bolus administration of intravenous contrast. CONTRAST:  63m OMNIPAQUE IOHEXOL 300 MG/ML  SOLN COMPARISON:  Ultrasound 06/23/2021 FINDINGS: Pharynx and larynx: I cannot identify a primary mucosal mass lesion. Salivary glands: Parotid and submandibular glands are normal. Thyroid: Normal except for a few scattered small nodules, the largest on the left measuring 1 cm in size. Lymph nodes: No lymphadenopathy on the right. Large confluent malignant appearing nodal mass of the left neck measuring up to 7.2 cm in size. The mass surrounds the left internal jugular vein and narrows the lumen to a diameter of 2 mm, but flow does persist. The mass  also encases the left internal carotid artery. Vascular: Atherosclerotic disease at both carotid bifurcations. See above discussion regarding tumor encasement of vessels in the left neck. Limited intracranial: Normal Visualized orbits: Normal Mastoids and visualized paranasal sinuses: Clear Skeleton: Chronic degenerative spondylosis. Upper chest: Pleural and parenchymal scarring at both lung apices. No lung mass or nodule in the region studied. Other: None IMPRESSION: Up to 7 cm in diameter malignant-appearing nodal mass in the left neck without identifiable mucosal primary lesion. The tumor surrounds and severely compresses the left internal jugular vein. There is also encasement of the left internal carotid artery. Electronically Signed   By: MNelson ChimesM.D.   On: 07/28/2021 15:44   NM PET Image Initial (PI) Skull Base To Thigh  Result Date: 08/09/2021 CLINICAL DATA:  Initial treatment strategy for left submandibular nodal mass on CT. Swelling for 2 months. EXAM: NUCLEAR MEDICINE PET SKULL BASE TO THIGH TECHNIQUE: 7.6 mCi F-18 FDG was injected intravenously. Full-ring PET imaging was performed from the skull base to thigh after the radiotracer. CT data was obtained and used for attenuation correction and anatomic localization. Fasting blood glucose: 93 mg/dl COMPARISON:  Neck CT 07/28/2021.  Abdominopelvic CT 06/14/2016. FINDINGS: Mediastinal blood  pool activity: SUV max 2.0 Liver activity: SUV max NA NECK: Hypermetabolism corresponding to the previously described dominant left neck mass. On the order of 5.1 x 2.7 cm and a S.U.V. max of 24.5 on 36/3. No mucosal primary or contralateral nodal disease identified. Incidental CT findings: Deferred to recent diagnostic CT. Bilateral carotid atherosclerosis. CHEST: A focus of hypermetabolism projecting in the right lower lobe including on image 104/3 (a S.U.V. max of 7.3) is without CT correlate and favored to represent embolization of radiopharmaceutical. No  thoracic nodal hypermetabolism. Incidental CT findings: Aortic and coronary artery calcification. Mild cardiomegaly. Small hiatal hernia with underdistention at the gastroesophageal junction. ABDOMEN/PELVIS: Multifocal hepatic metastasis. Segment 2 3.7 cm mass measures a S.U.V. max of 21.4 on 131/3. Segment 4B 2.0 cm lesion measures a S.U.V. max of 17.3 on 141/3. No abdominopelvic nodal hypermetabolism. Incidental CT findings: Normal adrenal glands. Abdominal aortic atherosclerosis. Normal noncontrast appearance of the pancreas, kidneys, uterus. Pelvic floor laxity. Possible tiny gallstone. SKELETON: Extensive osseous metastasis. Example L2 lesion at a S.U.V. max of 10.4. A C3 lesion measures a S.U.V. max of 5.2. Right posterior sacral lesion is CT occult at a S.U.V. max of 15.5 on 205/3. Right paramidline skull base lesion is also CT occult including at a S.U.V. max of 11.1 on 21/3. Incidental CT findings: none IMPRESSION: 1. Hepatic and widespread osseous metastasis, as detailed above. 2. Hypermetabolism corresponding to the known dominant left neck mass. This could represent site of primary or isolated cervical metastasis from an otherwise occult primary. 3. Incidental findings, including: Coronary artery atherosclerosis. Aortic Atherosclerosis (ICD10-I70.0). Small hiatal hernia. Electronically Signed   By: Abigail Miyamoto M.D.   On: 08/09/2021 14:06   [place] CORE BIOPSY (LYMPH NODES)  Result Date: 08/12/2021 INDICATION: No known primary, now with hypermetabolic left neck mass. Please perform ultrasound-guided biopsy for tissue diagnostic purposes. EXAM: ULTRASOUND-GUIDED LEFT NECK MASS BIOPSY COMPARISON:  PET-CT-08/08/2021; contrast-enhanced neck CT-07/28/2021 MEDICATIONS: None ANESTHESIA/SEDATION: None, per patient request COMPLICATIONS: None immediate. TECHNIQUE: Informed written consent was obtained from the patient after a discussion of the risks, benefits and alternatives to treatment. Questions regarding the  procedure were encouraged and answered. Initial ultrasound scanning demonstrated infiltrative isoechoic mass within the left side of the neck, correlating with the hypermetabolic mass seen on preceding PET-CT image 33, series 603. An ultrasound image was saved for documentation purposes. The procedure was planned. A timeout was performed prior to the initiation of the procedure. The operative was prepped and draped in the usual sterile fashion, and a sterile drape was applied covering the operative field. A timeout was performed prior to the initiation of the procedure. Local anesthesia was provided with 1% lidocaine with epinephrine. Under direct ultrasound guidance, an 18 gauge core needle device was utilized to obtain to obtain 6 core needle biopsies of the infiltrative left-sided neck mass. The samples were placed in saline and submitted to pathology. The needle was removed and hemostasis was achieved with manual compression. Post procedure scan was negative for significant hematoma. A dressing was placed. The patient tolerated the procedure well without immediate postprocedural complication. IMPRESSION: Technically successful ultrasound guided biopsy of infiltrative left-sided neck mass. Electronically Signed   By: Sandi Mariscal M.D.   On: 08/12/2021 12:10      ASSESSMENT & PLAN:  1. Diffuse large B-cell lymphoma of lymph nodes of multiple regions [facility])    #Diffuse large B-cell lymphoma, stage IV, hepatic and osseous metastasis CNS-IPI 4 point, high risk  PET scan images were independently reviewed  by me and discussed with patient. Biopsy report was reviewed and discussed. The diagnosis and care plan were discussed with patient in detail.  We discussed about that CD30 positive diffuse large B lymphoma is an aggressive lymphoma.   Recommend patient to proceed with bone marrow biopsy.  Goals of treatment is with curative intent with aggressive chemotherapy systemically. Additional FISH testing is  pending.  If patient does not have the double hit diffuse large B-cell lymphoma, I recommend RCHOP x6 chemotherapy regimen with intrathecal chemo therapy. Chemotherapy education was provided.  We had discussed the composition of chemotherapy regimen, length of chemo cycle, duration of treatment and the time to assess response to treatment.  I explained to the patient the risks and benefits of chemotherapy including all but not limited to hair loss, mouth sore, nausea, vomiting, diarrhea, low blood counts, heart failure, bleeding, neuropathy, reactivation of virus infection, and risk of life threatening infection and even death, secondary malignancy etc.  .  Patient will need to have 2D echo assessment prior to the chemotherapy.  Patient is hesitant about proceeding with standard chemotherapy protocol with R-CHOP , she would like to initiate as a less aggressive approach and see how she dose and may consider switch to standard of care.  Patient understands that rituximab is single agent is most likely not enough to control her disease. I recommend patient to be started on single agent Rituximab, if she decides to stay on single agent rituximab, plan weekly x4.   # Chemotherapy education; patient will defer Medi- port placement for Rituxan treatment.  We will utilize peripheral vein access.  She will need a Mediport placement if she decides to be switched to R-CHOP.  I will start patient on allopurinol for tumor prophylaxis.  Supportive care measures are necessary for patient well-being and will be provided as necessary. We spent sufficient time to discuss many aspect of care, questions were answered to patient's satisfaction.   No orders of the defined types were placed in this encounter.   All questions were answered. The patient knows to call the clinic with any problems questions or concerns.  cc [name], MD    Return of visit: Lab MD rituximab in 1 to 2 weeks.   Earlie Server, MD,  PhD [facility] Hematology Oncology 08/19/2021

## 2021-08-19 NOTE — Telephone Encounter (Signed)
Dr. Tasia Catchings signed the form. Benjamine Mola will make a copy for the chart. She will give the original copy to the patient at her scheduled apt on Tuesday next week.

## 2021-08-20 ENCOUNTER — Encounter: Payer: Self-pay | Admitting: Oncology

## 2021-08-20 MED ORDER — ALLOPURINOL 300 MG PO TABS: 300.0000 mg | ORAL_TABLET | Freq: Every day | ORAL | 3 refills | Status: AC

## 2021-08-20 MED ORDER — PROCHLORPERAZINE MALEATE 10 MG PO TABS: 10.0000 mg | ORAL_TABLET | Freq: Four times a day (QID) | ORAL | 6 refills | Status: AC | PRN

## 2021-08-20 NOTE — Progress Notes (Signed)

## 2021-08-23 ENCOUNTER — Inpatient Hospital Stay: Payer: Medicare Other

## 2021-08-23 ENCOUNTER — Telehealth: Payer: Self-pay

## 2021-08-23 ENCOUNTER — Encounter: Payer: Self-pay | Admitting: Oncology

## 2021-08-23 NOTE — Telephone Encounter (Signed)
Spoke to Kim Espinoza and encouraged her to have Kim Espinoza establish care with Palliative. Explained to her that palliative does not necessarily mean she is going on hospice. She voiced understanding but states that she wants to hold off on scheduling appt for now. Kim Espinoza is currently not having any symptoms and she does not want her to get depressed by initiating this. She said that when she is ready, they will call back and set up appt to establish care with Josh.

## 2021-08-23 NOTE — Progress Notes (Signed)
Pharmacist Chemotherapy Monitoring - Initial Assessment    Anticipated start date: 08/30/20   The following has been reviewed per standard work regarding the patient's treatment regimen: The patient's diagnosis, treatment plan and drug doses, and organ/hematologic function Lab orders and baseline tests specific to treatment regimen  The treatment plan start date, drug sequencing, and pre-medications Prior authorization status  Patient's documented medication list, including drug-drug interaction screen and prescriptions for anti-emetics and supportive care specific to the treatment regimen The drug concentrations, fluid compatibility, administration routes, and timing of the medications to be used The patient's access for treatment and lifetime cumulative dose history, if applicable  The patient's medication allergies and previous infusion related reactions, if applicable   Changes made to treatment plan:  treatment plan date  Follow up needed:  Pending authorization for treatment  and signing treatment plan RCHOP plan ordered - patient only getting ruxience    Adelina Mings, [place], 08/23/2021  9:08 AM

## 2021-08-23 NOTE — Telephone Encounter (Signed)
Patient's daughter, [name], sent mychart message stating that [name] has decided  that she does not want to proceed with treatment and that she want to initiate hospice.   Please cancel echo, MD follow up/tx. I will cancel biopsy.   Schedule patient for appt with Josh (in person or virtual per pt preference) to initiate hospice.

## 2021-08-23 NOTE — Telephone Encounter (Signed)
I called pt's daughter to get pt scheduled with Josh. She stated that she is not quite ready for palliative or [facility]ice [facility] just yet. She is in contact with a Education officer, [employer] regarding hospice care and would just like an order to be placed for hospice care to be used whenever she is ready.

## 2021-08-24 ENCOUNTER — Ambulatory Visit: Payer: Medicare Other

## 2021-08-30 ENCOUNTER — Ambulatory Visit: Payer: Medicare Other | Admitting: Oncology

## 2021-08-30 ENCOUNTER — Other Ambulatory Visit: Payer: Medicare Other

## 2021-08-30 ENCOUNTER — Encounter: Payer: Self-pay | Admitting: Oncology

## 2021-08-30 ENCOUNTER — Ambulatory Visit: Payer: Medicare Other

## 2021-08-30 NOTE — Telephone Encounter (Signed)
Hi Josh,   I called pt's daughter about 2 weeks ago to ask about setting up palliative appt with you and then transitioning to hospice, but they wanted to hold off on that. Now they are requesting to be referred to hospice. Would you be able to assist with this or would I just need to fax and order to hospice?

## 2021-08-30 NOTE — Telephone Encounter (Signed)
I called in an order for hospice to [facility].  

## 2021-08-31 ENCOUNTER — Ambulatory Visit: Payer: Medicare Other

## 2021-09-01 ENCOUNTER — Ambulatory Visit: Payer: Medicare Other

## 2021-09-08 ENCOUNTER — Encounter: Payer: Self-pay | Admitting: Oncology

## 2021-09-08 LAB — SURGICAL PATHOLOGY

## 2021-10-20 ENCOUNTER — Encounter (INDEPENDENT_AMBULATORY_CARE_PROVIDER_SITE_OTHER): Payer: Self-pay | Admitting: Nurse Practitioner

## 2021-11-19 DEATH — deceased

## 2022-02-14 ENCOUNTER — Other Ambulatory Visit: Payer: Self-pay | Admitting: Urology
